# Patient Record
Sex: Female | Born: 1960 | Race: Black or African American | Hispanic: No | Smoking: Never smoker
Health system: Southern US, Community
[De-identification: ages and names within clinical notes are randomized; demographics above are authoritative.]

## PROBLEM LIST (undated history)

## (undated) DIAGNOSIS — M94261 Chondromalacia, right knee: Secondary | ICD-10-CM

## (undated) DIAGNOSIS — M549 Dorsalgia, unspecified: Secondary | ICD-10-CM

## (undated) DIAGNOSIS — F329 Major depressive disorder, single episode, unspecified: Secondary | ICD-10-CM

## (undated) DIAGNOSIS — R51 Headache: Secondary | ICD-10-CM

## (undated) DIAGNOSIS — M1990 Unspecified osteoarthritis, unspecified site: Secondary | ICD-10-CM

## (undated) DIAGNOSIS — R519 Headache, unspecified: Secondary | ICD-10-CM

## (undated) DIAGNOSIS — K219 Gastro-esophageal reflux disease without esophagitis: Secondary | ICD-10-CM

## (undated) DIAGNOSIS — F32A Depression, unspecified: Secondary | ICD-10-CM

## (undated) DIAGNOSIS — I10 Essential (primary) hypertension: Secondary | ICD-10-CM

## (undated) DIAGNOSIS — G8929 Other chronic pain: Secondary | ICD-10-CM

## (undated) DIAGNOSIS — J189 Pneumonia, unspecified organism: Secondary | ICD-10-CM

## (undated) DIAGNOSIS — M797 Fibromyalgia: Secondary | ICD-10-CM

## (undated) DIAGNOSIS — E7800 Pure hypercholesterolemia, unspecified: Secondary | ICD-10-CM

## (undated) HISTORY — PX: CHOLECYSTECTOMY: SHX55

## (undated) HISTORY — PX: ABDOMINAL HYSTERECTOMY: SHX81

## (undated) HISTORY — PX: TONSILLECTOMY: SUR1361

## (undated) HISTORY — PX: TONSILLECTOMY AND ADENOIDECTOMY: SHX28

---

## 2010-05-29 HISTORY — PX: CHOLECYSTECTOMY: SHX55

## 2011-08-24 ENCOUNTER — Emergency Department (HOSPITAL_BASED_OUTPATIENT_CLINIC_OR_DEPARTMENT_OTHER)
Admission: EM | Admit: 2011-08-24 | Discharge: 2011-08-24 | Disposition: A | Discharge status: Home / Self Care | Payer: PRIVATE HEALTH INSURANCE | Attending: Emergency Medicine | Admitting: Emergency Medicine

## 2011-08-24 ENCOUNTER — Emergency Department (INDEPENDENT_AMBULATORY_CARE_PROVIDER_SITE_OTHER): Payer: PRIVATE HEALTH INSURANCE

## 2011-08-24 ENCOUNTER — Encounter (HOSPITAL_BASED_OUTPATIENT_CLINIC_OR_DEPARTMENT_OTHER): Payer: Self-pay | Admitting: *Deleted

## 2011-08-24 DIAGNOSIS — N83209 Unspecified ovarian cyst, unspecified side: Secondary | ICD-10-CM

## 2011-08-24 DIAGNOSIS — S39012A Strain of muscle, fascia and tendon of lower back, initial encounter: Secondary | ICD-10-CM

## 2011-08-24 DIAGNOSIS — M545 Low back pain, unspecified: Secondary | ICD-10-CM | POA: Insufficient documentation

## 2011-08-24 DIAGNOSIS — R319 Hematuria, unspecified: Secondary | ICD-10-CM | POA: Insufficient documentation

## 2011-08-24 DIAGNOSIS — Z7982 Long term (current) use of aspirin: Secondary | ICD-10-CM | POA: Insufficient documentation

## 2011-08-24 DIAGNOSIS — Z90710 Acquired absence of both cervix and uterus: Secondary | ICD-10-CM

## 2011-08-24 DIAGNOSIS — IMO0002 Reserved for concepts with insufficient information to code with codable children: Secondary | ICD-10-CM | POA: Insufficient documentation

## 2011-08-24 DIAGNOSIS — X58XXXA Exposure to other specified factors, initial encounter: Secondary | ICD-10-CM | POA: Insufficient documentation

## 2011-08-24 DIAGNOSIS — E7800 Pure hypercholesterolemia, unspecified: Secondary | ICD-10-CM | POA: Insufficient documentation

## 2011-08-24 HISTORY — DX: Pure hypercholesterolemia, unspecified: E78.00

## 2011-08-24 LAB — URINALYSIS, ROUTINE W REFLEX MICROSCOPIC
Glucose, UA: NEGATIVE mg/dL
Ketones, ur: NEGATIVE mg/dL
Leukocytes, UA: NEGATIVE
Protein, ur: NEGATIVE mg/dL
Urobilinogen, UA: 0.2 mg/dL (ref 0.0–1.0)

## 2011-08-24 LAB — URINE MICROSCOPIC-ADD ON

## 2011-08-24 MED ORDER — HYDROCODONE-ACETAMINOPHEN 5-500 MG PO TABS: 1.0000 | ORAL_TABLET | Freq: Four times a day (QID) | ORAL | Status: AC | PRN

## 2011-08-24 MED ORDER — OXYCODONE-ACETAMINOPHEN 5-325 MG PO TABS
2.0000 | ORAL_TABLET | Freq: Once | ORAL | Status: AC
  Administered 2011-08-24: 2 via ORAL
  Filled 2011-08-24: qty 2

## 2011-08-24 MED ORDER — CYCLOBENZAPRINE HCL 10 MG PO TABS: 10.0000 mg | ORAL_TABLET | Freq: Two times a day (BID) | ORAL | Status: AC | PRN

## 2011-08-24 NOTE — ED Provider Notes (Signed)
History     CSN: [id number]  Arrival date & time 08/24/11  1718   First MD Initiated Contact with Patient 08/24/11 1728      Chief Complaint  Patient presents with  . Back Pain    (Consider location/radiation/quality/duration/timing/severity/associated sxs/prior treatment) Patient is a 51 y.o. female presenting with back pain. The history is provided by the patient.  Back Pain  This is a new problem. Episode onset: 4 days ago. The problem occurs constantly. The problem has been gradually worsening. The pain is associated with no known injury. The pain is present in the lumbar spine. The quality of the pain is described as aching. The pain does not radiate. The pain is moderate. The symptoms are aggravated by twisting (walking). The pain is the same all the time. Pertinent negatives include no chest pain, no fever, no numbness, no headaches, no abdominal pain, no bowel incontinence, no perianal numbness, no bladder incontinence, no dysuria, no pelvic pain, no leg pain, no paresthesias, no tingling and no weakness.    Past Medical History  Diagnosis Date  . High cholesterol     Past Surgical History  Procedure Date  . Abdominal hysterectomy   . Tonsillectomy   . Cholecystectomy     No family history on file.  History  Substance Use Topics  . Smoking status: Never Smoker   . Smokeless tobacco: Not on file  . Alcohol Use: No    OB History    Grav Para Term Preterm Abortions TAB SAB Ect Mult Living                  Review of Systems  Constitutional: Negative for fever, chills, diaphoresis and fatigue.  HENT: Negative for congestion, rhinorrhea and sneezing.   Eyes: Negative.   Respiratory: Negative for cough, chest tightness and shortness of breath.   Cardiovascular: Negative for chest pain and leg swelling.  Gastrointestinal: Negative for nausea, vomiting, abdominal pain, diarrhea, blood in stool and bowel incontinence.  Genitourinary: Negative for bladder  incontinence, dysuria, frequency, hematuria, flank pain, difficulty urinating and pelvic pain.  Musculoskeletal: Positive for back pain. Negative for arthralgias.  Skin: Negative for rash.  Neurological: Negative for dizziness, tingling, speech difficulty, weakness, numbness, headaches and paresthesias.    Allergies  Review of patient's allergies indicates no known allergies.  Home Medications   Current Outpatient Rx  Name Route Sig Dispense Refill  . ASPIRIN 81 MG PO CHEW Oral Chew 81 mg by mouth daily.    Marland Kitchen VITAMIN D-3 5000 UNITS PO TABS Oral Take 10,000 Units by mouth daily.    Marland Kitchen PRAVASTATIN SODIUM 10 MG PO TABS Oral Take 10 mg by mouth at bedtime.    . CYCLOBENZAPRINE HCL 10 MG PO TABS Oral Take 1 tablet (10 mg total) by mouth 2 (two) times daily as needed for muscle spasms. 20 tablet 0  . HYDROCODONE-ACETAMINOPHEN 5-500 MG PO TABS Oral Take 1-2 tablets by mouth every 6 (six) hours as needed for pain. 15 tablet 0    BP 151/83  Pulse 94  Temp(Src) 97.9 F (36.6 C) (Oral)  Resp 18  SpO2 100%  Physical Exam  Constitutional: She is oriented to person, place, and time. She appears well-developed and well-nourished.  HENT:  Head: Normocephalic and atraumatic.  Eyes: Pupils are equal, round, and reactive to light.  Neck: Normal range of motion. Neck supple.  Cardiovascular: Normal rate, regular rhythm and normal heart sounds.   Pulmonary/Chest: Effort normal and breath sounds normal. No  respiratory distress. She has no wheezes. She has no rales. She exhibits no tenderness.  Abdominal: Soft. Bowel sounds are normal. There is no tenderness. There is no rebound and no guarding.  Musculoskeletal: Normal range of motion. She exhibits no edema and no tenderness.       No pain along spine  Lymphadenopathy:    She has no cervical adenopathy.  Neurological: She is alert and oriented to person, place, and time. She has normal strength. No sensory deficit.  Skin: Skin is warm and dry. No  rash noted.  Psychiatric: She has a normal mood and affect.    ED Course  Procedures (including critical care time)  Results for orders placed during the hospital encounter of 08/24/11  URINALYSIS, ROUTINE W REFLEX MICROSCOPIC      Component Value Range   Color, Urine YELLOW  YELLOW    APPearance CLEAR  CLEAR    Specific Gravity, Urine 1.021  1.005 - 1.030    pH 6.5  5.0 - 8.0    Glucose, UA NEGATIVE  NEGATIVE (mg/dL)   Hgb urine dipstick MODERATE (*) NEGATIVE    Bilirubin Urine NEGATIVE  NEGATIVE    Ketones, ur NEGATIVE  NEGATIVE (mg/dL)   Protein, ur NEGATIVE  NEGATIVE (mg/dL)   Urobilinogen, UA 0.2  0.0 - 1.0 (mg/dL)   Nitrite NEGATIVE  NEGATIVE    Leukocytes, UA NEGATIVE  NEGATIVE   URINE MICROSCOPIC-ADD ON      Component Value Range   Squamous Epithelial / LPF FEW (*) RARE    WBC, UA 0-2  <3 (WBC/hpf)   RBC / HPF 7-10  <3 (RBC/hpf)   Bacteria, UA FEW (*) RARE    Ct Abdomen Pelvis Wo Contrast  08/24/2011  *RADIOLOGY REPORT*  Clinical Data: Low back pain for the past week.  Hematuria. Previous hysterectomy and cholecystectomy.  CT ABDOMEN AND PELVIS WITHOUT CONTRAST  Technique:  Multidetector CT imaging of the abdomen and pelvis was performed following the standard protocol without intravenous contrast.  Comparison: None.  Findings: Normal appearing kidneys, ureters and urinary bladder. No urinary tract calculi or hydronephrosis.  3.7 cm left ovarian cyst.  Normal appearing right ovary. Surgically absent uterus.  Normal appearing appendix in the right lower abdomen and upper pelvis.  Small number of colonic diverticula without evidence of diverticulitis.  No enlarged lymph nodes.  Cholecystectomy clips.  Unremarkable noncontrasted appearance of the liver, spleen, pancreas and adrenal glands.  Clear lung bases. Minimal lumbar spine degenerative changes.  IMPRESSION:  1.  No urinary tract calculi and no acute abnormality. 2.  3.7 cm left ovarian cyst.  This could be better  characterized with elective pelvic ultrasound.  Original Report Authenticated By: Darrol Avielle, M.D.       1. Back strain       MDM  Patient with bilateral lower back pain. It's worse with movement, but is not reproducible on palpation. There is no evidence of renal calculi. No evidence of urinary infection. Pain is likely musculoskeletal. She has no abdominal pain to suggest other abdominal etiology. I did advise her that she does have hematuria that is unexplained, and she'll need a followup with her primary care provider regarding this to have her urine rechecked. If she has persistent, hematuria she'll need to see a urologist. Maryclare Labrador prescribe her pain medicine and muscle relaxers for her back never followed a primary care physician.   1945:  Talked with pt, she has seen Dr. Pete Glatter with urology in [place] who did  a hematuria work up and she says that everything was fine     Rolan Bucco, MD 08/24/11 1949

## 2011-08-24 NOTE — Discharge Instructions (Signed)
Back Pain, Adult Low back pain is very common. About 1 in 5 people have back pain.The cause of low back pain is rarely dangerous. The pain often gets better over time.About half of people with a sudden onset of back pain feel better in just 2 weeks. About 8 in 10 people feel better by 6 weeks.  CAUSES Some common causes of back pain include:  Strain of the muscles or ligaments supporting the spine.   Wear and tear (degeneration) of the spinal discs.   Arthritis.   Direct injury to the back.  DIAGNOSIS Most of the time, the direct cause of low back pain is not known.However, back pain can be treated effectively even when the exact cause of the pain is unknown.Answering your caregiver's questions about your overall health and symptoms is one of the most accurate ways to make sure the cause of your pain is not dangerous. If your caregiver needs more information, he or she may order lab work or imaging tests (X-rays or MRIs).However, even if imaging tests show changes in your back, this usually does not require surgery. HOME CARE INSTRUCTIONS For many people, back pain returns.Since low back pain is rarely dangerous, it is often a condition that people can learn to manageon their own.   Remain active. It is stressful on the back to sit or stand in one place. Do not sit, drive, or stand in one place for more than 30 minutes at a time. Take short walks on level surfaces as soon as pain allows.Try to increase the length of time you walk each day.   Do not stay in bed.Resting more than 1 or 2 days can delay your recovery.   Do not avoid exercise or work.Your body is made to move.It is not dangerous to be active, even though your back may hurt.Your back will likely heal faster if you return to being active before your pain is gone.   Pay attention to your body when you bend and lift. Many people have less discomfortwhen lifting if they bend their knees, keep the load close to their  bodies,and avoid twisting. Often, the most comfortable positions are those that put less stress on your recovering back.   Find a comfortable position to sleep. Use a firm mattress and lie on your side with your knees slightly bent. If you lie on your back, put a pillow under your knees.   Only take over-the-counter or prescription medicines as directed by your caregiver. Over-the-counter medicines to reduce pain and inflammation are often the most helpful.Your caregiver may prescribe muscle relaxant drugs.These medicines help dull your pain so you can more quickly return to your normal activities and healthy exercise.   Put ice on the injured area.   Put ice in a plastic bag.   Place a towel between your skin and the bag.   Leave the ice on for 15 to 20 minutes, 3 to 4 times a day for the first 2 to 3 days. After that, ice and heat may be alternated to reduce pain and spasms.   Ask your caregiver about trying back exercises and gentle massage. This may be of some benefit.   Avoid feeling anxious or stressed.Stress increases muscle tension and can worsen back pain.It is important to recognize when you are anxious or stressed and learn ways to manage it.Exercise is a great option.  SEEK MEDICAL CARE IF:  You have pain that is not relieved with rest or medicine.   You have   pain that does not improve in 1 week.   You have new symptoms.   You are generally not feeling well.  SEEK IMMEDIATE MEDICAL CARE IF:   You have pain that radiates from your back into your legs.   You develop new bowel or bladder control problems.   You have unusual weakness or numbness in your arms or legs.   You develop nausea or vomiting.   You develop abdominal pain.   You feel faint.  Document Released: 05/15/2005 Document Revised: 05/04/2011 Document Reviewed: 10/03/2010 Exit[employer] Patient Information 2012 [employer]. 

## 2011-08-24 NOTE — ED Notes (Signed)
Mid to lower back pain all week. No relief with heating pad and Ibuprofen. Hx of back spasms from a pulled muscle and this pain feels the same.

## 2012-02-29 ENCOUNTER — Emergency Department (HOSPITAL_BASED_OUTPATIENT_CLINIC_OR_DEPARTMENT_OTHER)
Admission: EM | Admit: 2012-02-29 | Discharge: 2012-02-29 | Disposition: A | Discharge status: Home / Self Care | Payer: No Typology Code available for payment source | Attending: Emergency Medicine | Admitting: Emergency Medicine

## 2012-02-29 ENCOUNTER — Encounter (HOSPITAL_BASED_OUTPATIENT_CLINIC_OR_DEPARTMENT_OTHER): Payer: Self-pay | Admitting: *Deleted

## 2012-02-29 DIAGNOSIS — Z7982 Long term (current) use of aspirin: Secondary | ICD-10-CM | POA: Insufficient documentation

## 2012-02-29 DIAGNOSIS — M545 Low back pain, unspecified: Secondary | ICD-10-CM | POA: Insufficient documentation

## 2012-02-29 DIAGNOSIS — M25559 Pain in unspecified hip: Secondary | ICD-10-CM | POA: Insufficient documentation

## 2012-02-29 MED ORDER — TRAMADOL HCL 50 MG PO TABS
50.0000 mg | ORAL_TABLET | Freq: Once | ORAL | Status: AC
  Administered 2012-02-29: 50 mg via ORAL
  Filled 2012-02-29: qty 1

## 2012-02-29 MED ORDER — TRAMADOL HCL 50 MG PO TABS: 50.0000 mg | ORAL_TABLET | Freq: Four times a day (QID) | ORAL | Status: AC | PRN

## 2012-02-29 MED ORDER — KETOROLAC TROMETHAMINE 60 MG/2ML IM SOLN
60.0000 mg | Freq: Once | INTRAMUSCULAR | Status: AC
  Administered 2012-02-29: 60 mg via INTRAMUSCULAR
  Filled 2012-02-29: qty 2

## 2012-02-29 NOTE — ED Provider Notes (Signed)
History     CSN: [id number]  Arrival date & time 02/29/12  1640   First MD Initiated Contact with Patient 02/29/12 1722      Chief Complaint  Patient presents with  . Back Pain  . Hip Pain    (Consider location/radiation/quality/duration/timing/severity/associated sxs/prior treatment) HPI Comments: Patient with 3 year history of intermittent low pains.  She has occasional flareups.  This particular episode started three weeks ago.  There has been no trauma or fall.  There is no bowel or bladder complaints.    Patient is a 51 y.o. female presenting with back pain. The history is provided by the patient.  Back Pain  This is a recurrent problem. Episode onset: 3 weeks ago. The problem occurs constantly. The problem has been gradually worsening. The pain is associated with no known injury. The pain is present in the lumbar spine. The quality of the pain is described as shooting, aching and stabbing. The pain does not radiate. The pain is moderate. The symptoms are aggravated by bending, twisting and certain positions. The pain is the same all the time. Pertinent negatives include no fever, no bladder incontinence, no paresthesias, no paresis and no tingling.    Past Medical History  Diagnosis Date  . High cholesterol     Past Surgical History  Procedure Date  . Abdominal hysterectomy   . Tonsillectomy   . Cholecystectomy     History reviewed. No pertinent family history.  History  Substance Use Topics  . Smoking status: Never Smoker   . Smokeless tobacco: Not on file  . Alcohol Use: No    OB History    Grav Para Term Preterm Abortions TAB SAB Ect Mult Living                  Review of Systems  Constitutional: Negative for fever.  Genitourinary: Negative for bladder incontinence.  Musculoskeletal: Positive for back pain.  Neurological: Negative for tingling and paresthesias.  All other systems reviewed and are negative.    Allergies  Review of patient's  allergies indicates no known allergies.  Home Medications   Current Outpatient Rx  Name Route Sig Dispense Refill  . ONE-DAILY MULTI VITAMINS PO TABS Oral Take 1 tablet by mouth daily.    Marland Kitchen VITAMIN C 500 MG PO TABS Oral Take 500 mg by mouth daily.    . ASPIRIN 81 MG PO CHEW Oral Chew 81 mg by mouth daily.    Marland Kitchen VITAMIN D-3 5000 UNITS PO TABS Oral Take 10,000 Units by mouth daily.    Marland Kitchen PRAVASTATIN SODIUM 10 MG PO TABS Oral Take 10 mg by mouth at bedtime.      BP 166/82  Pulse 85  Temp 98 F (36.7 C) (Oral)  Resp 18  Ht 5\' 4"  (1.626 m)  Wt 180 lb (81.647 kg)  BMI 30.90 kg/m2  SpO2 100%  Physical Exam  Nursing note and vitals reviewed. Constitutional: She is oriented to person, place, and time. She appears well-developed and well-nourished. No distress.  HENT:  Head: Normocephalic and atraumatic.  Neck: Normal range of motion. Neck supple.  Cardiovascular: Normal rate and regular rhythm.   Pulmonary/Chest: Effort normal and breath sounds normal.  Abdominal: Soft. Bowel sounds are normal.  Musculoskeletal: Normal range of motion.       There is ttp in the lumbar soft tissues with no bony ttp or stepoffs.    Neurological: She is alert and oriented to person, place, and time.  Strength is 5/5 in the ble.  She is able to walk on heels, toes without difficulty.  Skin: Skin is warm and dry. She is not diaphoretic.    ED Course  Procedures (including critical care time)  Labs Reviewed - No data to display No results found.   No diagnosis found.    MDM  Sounds musculoskeletal in etiology.  There are no bowel or bladder complaints and no red flags for an emergent etiology.  No emergent imaging indicated.  She will be treated with nsaids and tramadol.  Offered stronger pain meds but declined.  To follow up with pcp to discuss further imaging when appropriate.          Geoffery Lyons, MD 02/29/12 [phone]

## 2012-02-29 NOTE — ED Notes (Signed)
Pt c/o bil hip and lower back pain x 1 week

## 2012-05-09 IMAGING — CT CT ABD-PELV W/O CM
2 of 4 series · 16 of 46 positions shown, 18 images · non-contrast
Comparison: None.

CLINICAL DATA: Low back pain for the past week.  Hematuria.
Previous hysterectomy and cholecystectomy.

CT ABDOMEN AND PELVIS WITHOUT CONTRAST
TECHNIQUE: Multidetector CT imaging of the abdomen and pelvis was
performed following the standard protocol without intravenous
contrast.

[Series 2: renal stone > 200 lbs 5.0 b31f · axial · 0.71mm/px · z∈[+791,+1191]mm · 13 of 88 slices shown, 15 images]
[im 4/88  soft-tissue]
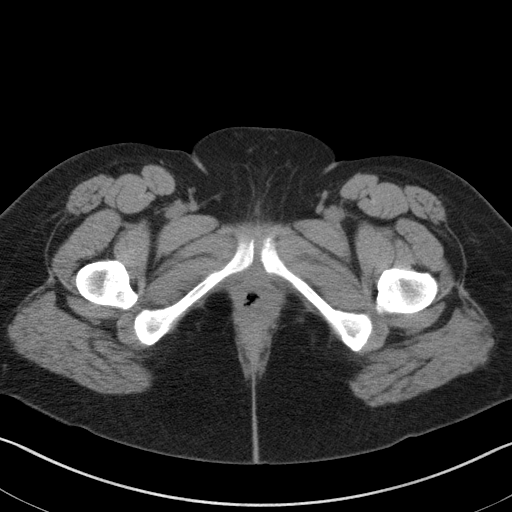
[im 4/88  bone]
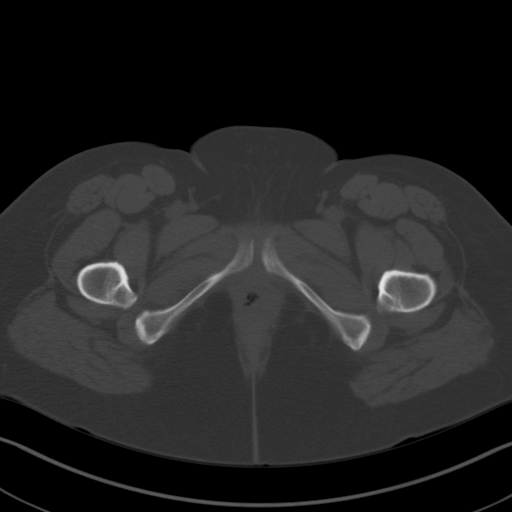
[im 11/88  soft-tissue]
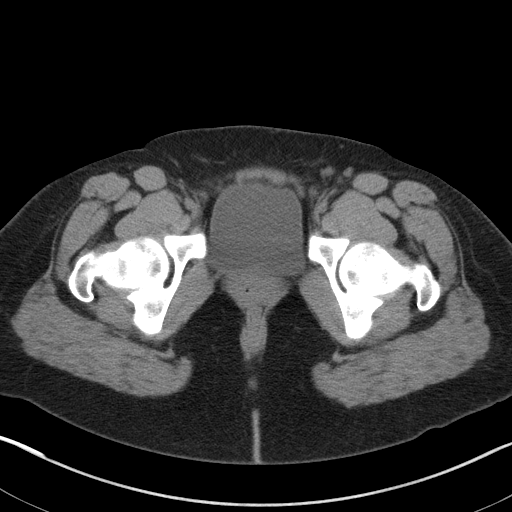
[im 18/88  soft-tissue]
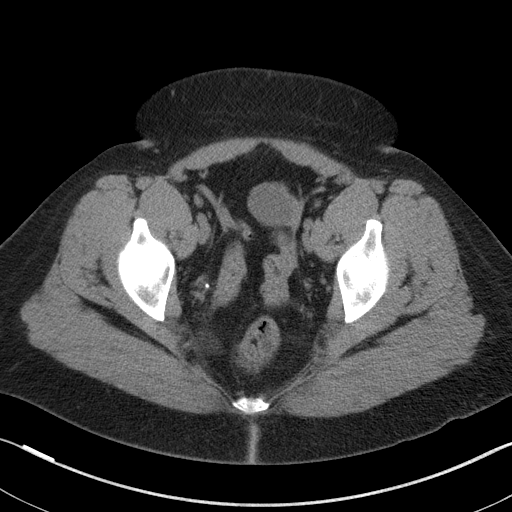
[im 25/88  soft-tissue]
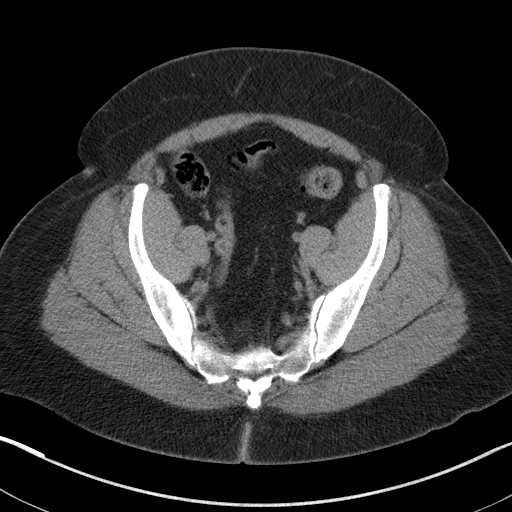
[im 32/88  soft-tissue]
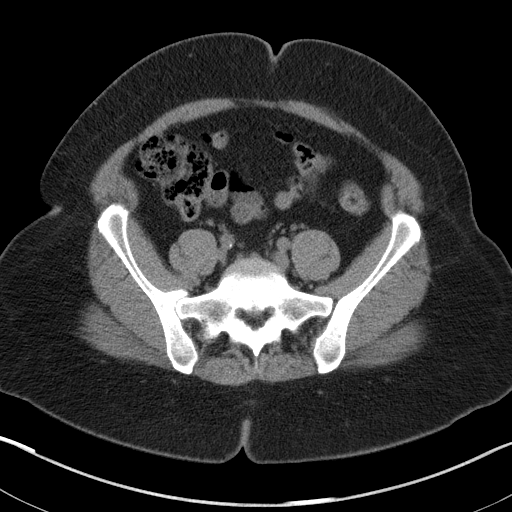
[im 39/88  soft-tissue]
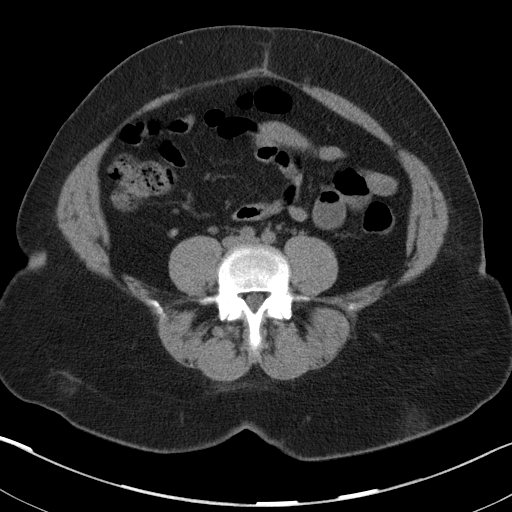
[im 46/88  soft-tissue]
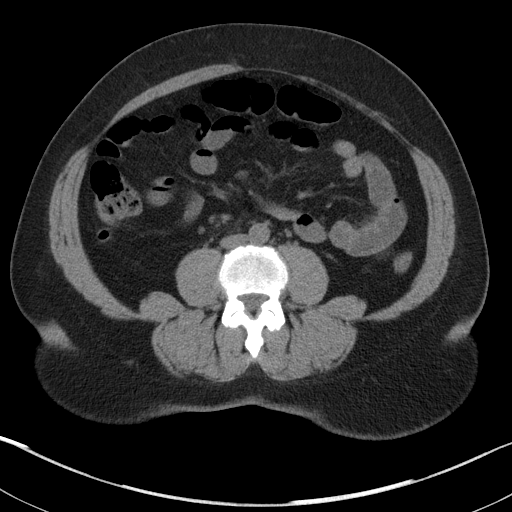
[im 49/88  soft-tissue]
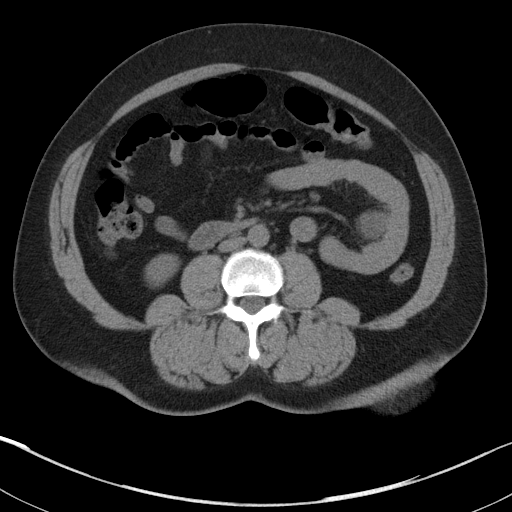
[im 56/88  soft-tissue]
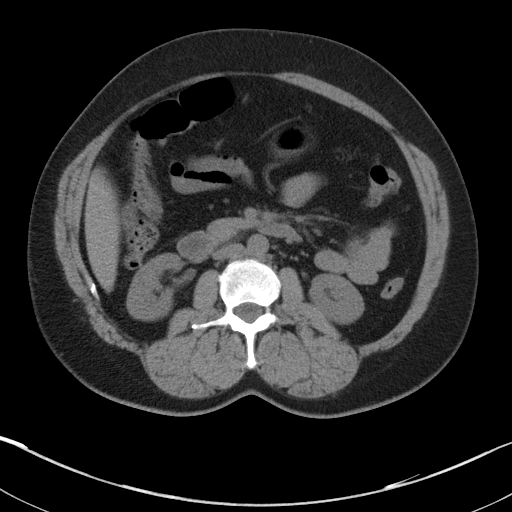
[im 56/88  bone]
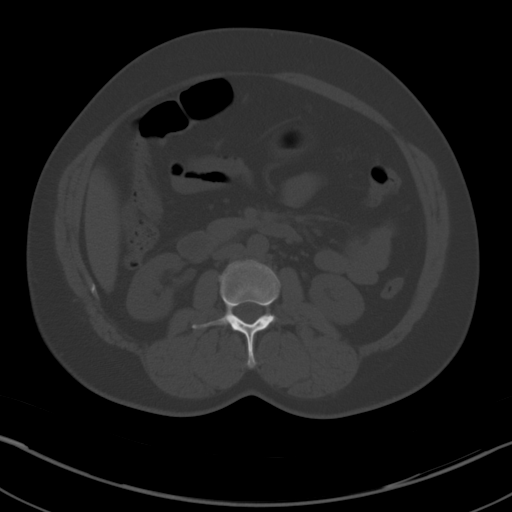
[im 63/88  soft-tissue]
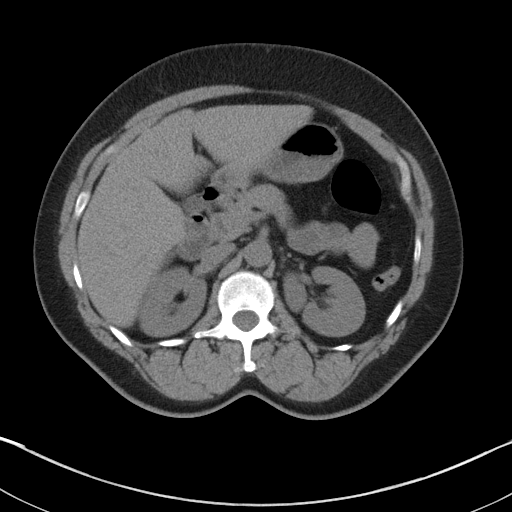
[im 70/88  soft-tissue]
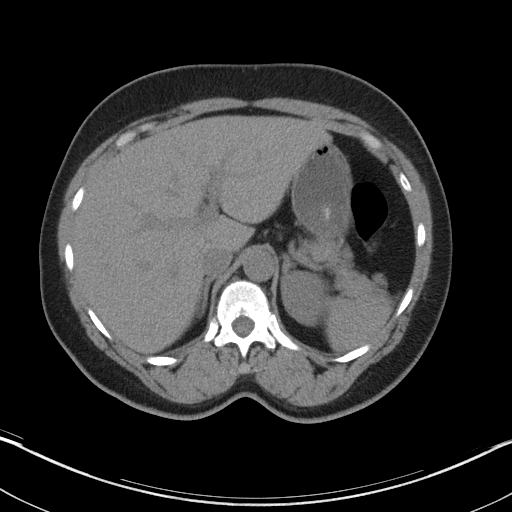
[im 77/88  soft-tissue]
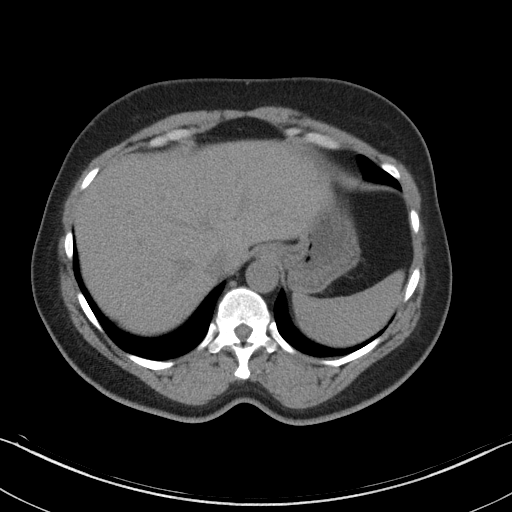
[im 84/88  soft-tissue]
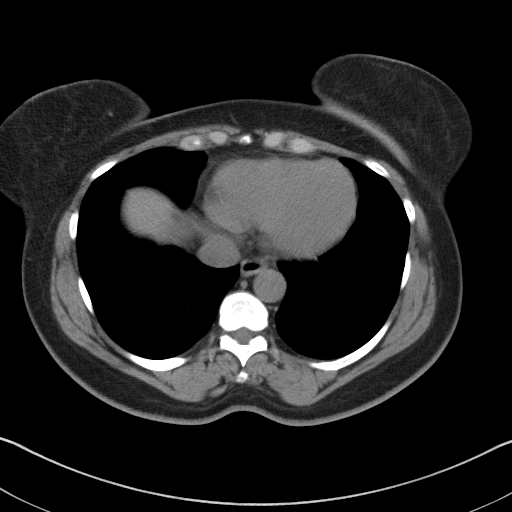

[Series 5: renal stone 3.0 coronal · coronal · 0.64mm/px · 3 of 101 slices shown]
[im 34/101  soft-tissue]
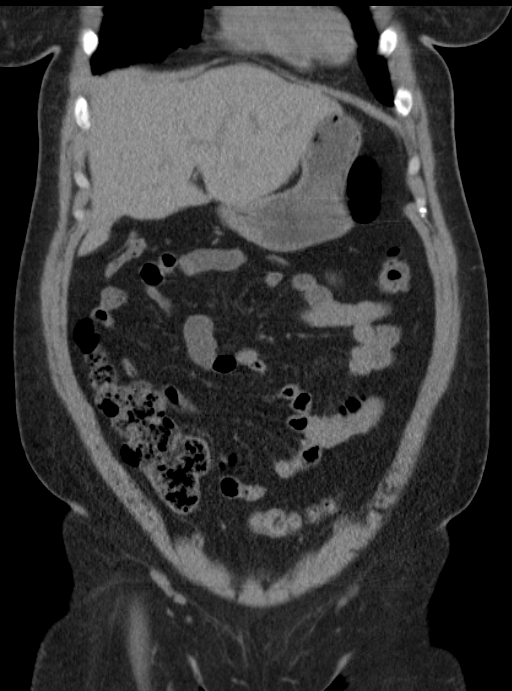
[im 45/101  soft-tissue]
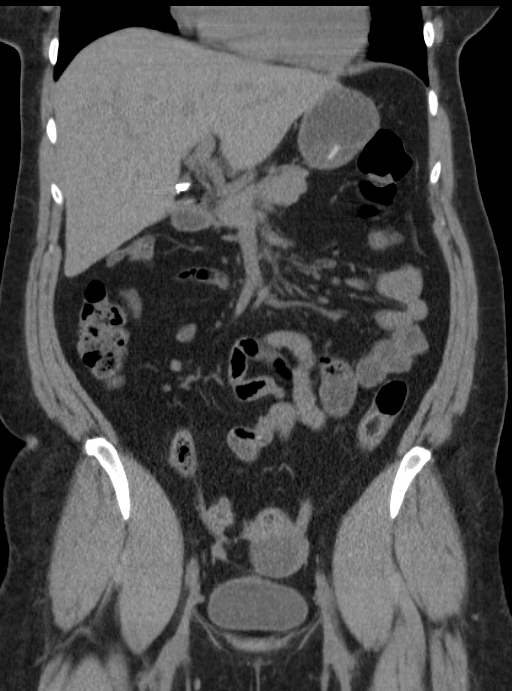
[im 56/101  soft-tissue]
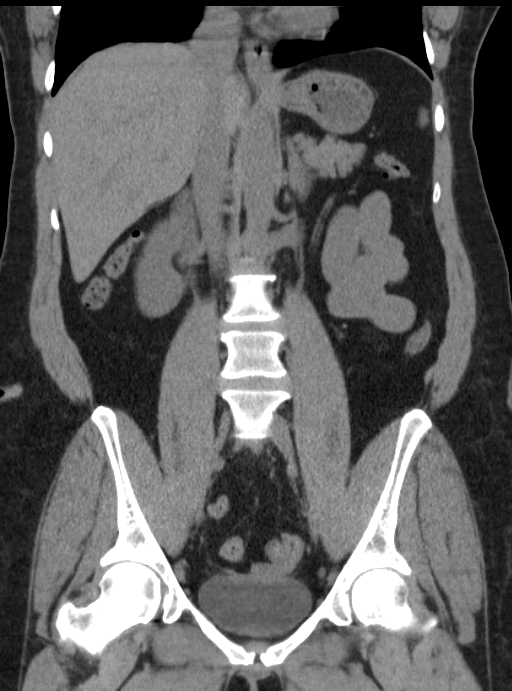

[16 of 46 positions shown; findings below may reference images not displayed]

FINDINGS: Normal appearing kidneys, ureters and urinary bladder.
No urinary tract calculi or hydronephrosis.

3.7 cm left ovarian cyst.  Normal appearing right ovary.
Surgically absent uterus.

Normal appearing appendix in the right lower abdomen and upper
pelvis.  Small number of colonic diverticula without evidence of
diverticulitis.  No enlarged lymph nodes.

Cholecystectomy clips.  Unremarkable noncontrasted appearance of
the liver, spleen, pancreas and adrenal glands.  Clear lung bases.
Minimal lumbar spine degenerative changes.
IMPRESSION: 1.  No urinary tract calculi and no acute abnormality.
2.  3.7 cm left ovarian cyst.  This could be better characterized
with elective pelvic ultrasound.

## 2013-05-29 HISTORY — PX: CARPAL TUNNEL RELEASE: SHX101

## 2014-02-26 DIAGNOSIS — M94261 Chondromalacia, right knee: Secondary | ICD-10-CM

## 2014-02-26 HISTORY — DX: Chondromalacia, right knee: M94.261

## 2014-03-05 ENCOUNTER — Other Ambulatory Visit: Payer: Self-pay | Admitting: Orthopedic Surgery

## 2014-03-17 ENCOUNTER — Encounter (HOSPITAL_BASED_OUTPATIENT_CLINIC_OR_DEPARTMENT_OTHER): Payer: Self-pay | Admitting: *Deleted

## 2014-03-18 ENCOUNTER — Encounter (HOSPITAL_BASED_OUTPATIENT_CLINIC_OR_DEPARTMENT_OTHER): Payer: Self-pay | Admitting: *Deleted

## 2014-03-23 ENCOUNTER — Encounter (HOSPITAL_BASED_OUTPATIENT_CLINIC_OR_DEPARTMENT_OTHER): Payer: Self-pay | Admitting: Anesthesiology

## 2014-03-23 ENCOUNTER — Encounter (HOSPITAL_BASED_OUTPATIENT_CLINIC_OR_DEPARTMENT_OTHER): Payer: 59 | Admitting: Anesthesiology

## 2014-03-23 ENCOUNTER — Ambulatory Visit (HOSPITAL_BASED_OUTPATIENT_CLINIC_OR_DEPARTMENT_OTHER): Payer: 59 | Admitting: Anesthesiology

## 2014-03-23 ENCOUNTER — Ambulatory Visit (HOSPITAL_BASED_OUTPATIENT_CLINIC_OR_DEPARTMENT_OTHER)
Admission: RE | Admit: 2014-03-23 | Discharge: 2014-03-23 | Disposition: A | Discharge status: Home / Self Care | Payer: 59 | Source: Ambulatory Visit | Attending: Orthopedic Surgery | Admitting: Orthopedic Surgery

## 2014-03-23 ENCOUNTER — Encounter (HOSPITAL_BASED_OUTPATIENT_CLINIC_OR_DEPARTMENT_OTHER)
Admission: RE | Disposition: A | Discharge status: Home / Self Care | Payer: Self-pay | Source: Ambulatory Visit | Attending: Orthopedic Surgery

## 2014-03-23 DIAGNOSIS — E780 Pure hypercholesterolemia: Secondary | ICD-10-CM | POA: Insufficient documentation

## 2014-03-23 DIAGNOSIS — M94261 Chondromalacia, right knee: Secondary | ICD-10-CM

## 2014-03-23 DIAGNOSIS — K219 Gastro-esophageal reflux disease without esophagitis: Secondary | ICD-10-CM | POA: Diagnosis not present

## 2014-03-23 DIAGNOSIS — M2241 Chondromalacia patellae, right knee: Secondary | ICD-10-CM | POA: Insufficient documentation

## 2014-03-23 DIAGNOSIS — Z7982 Long term (current) use of aspirin: Secondary | ICD-10-CM | POA: Diagnosis not present

## 2014-03-23 HISTORY — DX: Dorsalgia, unspecified: M54.9

## 2014-03-23 HISTORY — DX: Chondromalacia, right knee: M94.261

## 2014-03-23 HISTORY — DX: Other chronic pain: G89.29

## 2014-03-23 HISTORY — PX: KNEE ARTHROSCOPY: SHX127

## 2014-03-23 HISTORY — DX: Unspecified osteoarthritis, unspecified site: M19.90

## 2014-03-23 HISTORY — DX: Gastro-esophageal reflux disease without esophagitis: K21.9

## 2014-03-23 LAB — POCT HEMOGLOBIN-HEMACUE: HEMOGLOBIN: 14.2 g/dL (ref 12.0–15.0)

## 2014-03-23 SURGERY — ARTHROSCOPY, KNEE
Anesthesia: General | Site: Knee | Laterality: Right

## 2014-03-23 MED ORDER — LIDOCAINE HCL (CARDIAC) 20 MG/ML IV SOLN
INTRAVENOUS | Status: DC | PRN
  Administered 2014-03-23: 50 mg via INTRAVENOUS

## 2014-03-23 MED ORDER — HYDROMORPHONE HCL 1 MG/ML IJ SOLN
INTRAMUSCULAR | Status: AC
  Filled 2014-03-23: qty 1

## 2014-03-23 MED ORDER — SODIUM CHLORIDE 0.9 % IR SOLN
Status: DC | PRN
  Administered 2014-03-23: 14:00:00

## 2014-03-23 MED ORDER — LACTATED RINGERS IV SOLN
INTRAVENOUS | Status: DC
  Administered 2014-03-23 (×2): via INTRAVENOUS

## 2014-03-23 MED ORDER — OXYCODONE HCL 5 MG PO TABS: 5.0000 mg | ORAL_TABLET | Freq: Once | ORAL | Status: DC | PRN

## 2014-03-23 MED ORDER — BUPIVACAINE-EPINEPHRINE 0.5% -1:200000 IJ SOLN
INTRAMUSCULAR | Status: DC | PRN
  Administered 2014-03-23: 20 mL

## 2014-03-23 MED ORDER — HYDROCODONE-ACETAMINOPHEN 5-325 MG PO TABS: 1.0000 | ORAL_TABLET | Freq: Four times a day (QID) | ORAL | Status: DC | PRN

## 2014-03-23 MED ORDER — DEXAMETHASONE SODIUM PHOSPHATE 10 MG/ML IJ SOLN
INTRAMUSCULAR | Status: DC | PRN
  Administered 2014-03-23: 10 mg via INTRAVENOUS

## 2014-03-23 MED ORDER — MIDAZOLAM HCL 2 MG/ML PO SYRP: 12.0000 mg | ORAL_SOLUTION | Freq: Once | ORAL | Status: DC | PRN

## 2014-03-23 MED ORDER — ONDANSETRON HCL 4 MG/2ML IJ SOLN
INTRAMUSCULAR | Status: DC | PRN
  Administered 2014-03-23: 4 mg via INTRAVENOUS

## 2014-03-23 MED ORDER — OXYCODONE HCL 5 MG/5ML PO SOLN: 5.0000 mg | Freq: Once | ORAL | Status: DC | PRN

## 2014-03-23 MED ORDER — DEXTROSE-NACL 5-0.45 % IV SOLN: INTRAVENOUS | Status: DC

## 2014-03-23 MED ORDER — SCOPOLAMINE 1 MG/3DAYS TD PT72
1.0000 | MEDICATED_PATCH | Freq: Once | TRANSDERMAL | Status: DC
  Administered 2014-03-23: 1.5 mg via TRANSDERMAL

## 2014-03-23 MED ORDER — FENTANYL CITRATE 0.05 MG/ML IJ SOLN: 50.0000 ug | INTRAMUSCULAR | Status: DC | PRN

## 2014-03-23 MED ORDER — PROPOFOL 10 MG/ML IV BOLUS
INTRAVENOUS | Status: DC | PRN
  Administered 2014-03-23: 200 mg via INTRAVENOUS

## 2014-03-23 MED ORDER — CEFAZOLIN SODIUM-DEXTROSE 2-3 GM-% IV SOLR
2.0000 g | INTRAVENOUS | Status: AC
  Administered 2014-03-23: 2 g via INTRAVENOUS

## 2014-03-23 MED ORDER — MIDAZOLAM HCL 2 MG/2ML IJ SOLN: 1.0000 mg | INTRAMUSCULAR | Status: DC | PRN

## 2014-03-23 MED ORDER — MIDAZOLAM HCL 5 MG/5ML IJ SOLN
INTRAMUSCULAR | Status: DC | PRN
  Administered 2014-03-23: 2 mg via INTRAVENOUS

## 2014-03-23 MED ORDER — FENTANYL CITRATE 0.05 MG/ML IJ SOLN
INTRAMUSCULAR | Status: DC | PRN
  Administered 2014-03-23: 100 ug via INTRAVENOUS

## 2014-03-23 MED ORDER — HYDROMORPHONE HCL 1 MG/ML IJ SOLN
0.2500 mg | INTRAMUSCULAR | Status: DC | PRN
  Administered 2014-03-23 (×3): 0.5 mg via INTRAVENOUS

## 2014-03-23 SURGICAL SUPPLY — 41 items
BANDAGE ELASTIC 6 VELCRO ST LF (GAUZE/BANDAGES/DRESSINGS) ×3 IMPLANT
BLADE 4.2CUDA (BLADE) IMPLANT
BLADE CUTTER GATOR 3.5 (BLADE) ×3 IMPLANT
BLADE GREAT WHITE 4.2 (BLADE) IMPLANT
BLADE GREAT WHITE 4.2MM (BLADE)
BNDG COHESIVE 6X5 TAN STRL LF (GAUZE/BANDAGES/DRESSINGS) ×3 IMPLANT
CANISTER SUCT 3000ML (MISCELLANEOUS) IMPLANT
DRAPE ARTHROSCOPY W/POUCH 114 (DRAPES) ×3 IMPLANT
DURAPREP 26ML APPLICATOR (WOUND CARE) ×3 IMPLANT
ELECT MENISCUS 165MM 90D (ELECTRODE) IMPLANT
ELECT REM PT RETURN 9FT ADLT (ELECTROSURGICAL)
ELECTRODE REM PT RTRN 9FT ADLT (ELECTROSURGICAL) IMPLANT
GAUZE SPONGE 4X4 12PLY STRL (GAUZE/BANDAGES/DRESSINGS) ×3 IMPLANT
GAUZE XEROFORM 1X8 LF (GAUZE/BANDAGES/DRESSINGS) ×3 IMPLANT
GLOVE BIO SURGEON STRL SZ7.5 (GLOVE) ×6 IMPLANT
GLOVE BIO SURGEON STRL SZ8.5 (GLOVE) ×3 IMPLANT
GLOVE BIOGEL PI IND STRL 8 (GLOVE) ×1 IMPLANT
GLOVE BIOGEL PI IND STRL 9 (GLOVE) ×1 IMPLANT
GLOVE BIOGEL PI INDICATOR 8 (GLOVE) ×2
GLOVE BIOGEL PI INDICATOR 9 (GLOVE) ×2
GOWN STRL REUS W/ TWL LRG LVL3 (GOWN DISPOSABLE) ×1 IMPLANT
GOWN STRL REUS W/ TWL XL LVL3 (GOWN DISPOSABLE) ×2 IMPLANT
GOWN STRL REUS W/TWL LRG LVL3 (GOWN DISPOSABLE) ×2
GOWN STRL REUS W/TWL XL LVL3 (GOWN DISPOSABLE) ×4
IV NS IRRIG 3000ML ARTHROMATIC (IV SOLUTION) ×3 IMPLANT
KNEE WRAP E Z 3 GEL PACK (MISCELLANEOUS) ×3 IMPLANT
MANIFOLD NEPTUNE II (INSTRUMENTS) IMPLANT
NDL SAFETY ECLIPSE 18X1.5 (NEEDLE) ×1 IMPLANT
NEEDLE HYPO 18GX1.5 SHARP (NEEDLE) ×2
PACK ARTHROSCOPY DSU (CUSTOM PROCEDURE TRAY) ×3 IMPLANT
PACK BASIN DAY SURGERY FS (CUSTOM PROCEDURE TRAY) ×3 IMPLANT
PAD ALCOHOL SWAB (MISCELLANEOUS) ×3 IMPLANT
PENCIL BUTTON HOLSTER BLD 10FT (ELECTRODE) IMPLANT
SET ARTHROSCOPY TUBING (MISCELLANEOUS) ×2
SET ARTHROSCOPY TUBING LN (MISCELLANEOUS) ×1 IMPLANT
SLEEVE SCD COMPRESS KNEE MED (MISCELLANEOUS) IMPLANT
SYR 3ML 18GX1 1/2 (SYRINGE) IMPLANT
SYR 5ML LL (SYRINGE) ×3 IMPLANT
TOWEL OR 17X24 6PK STRL BLUE (TOWEL DISPOSABLE) ×3 IMPLANT
WAND STAR VAC 90 (SURGICAL WAND) IMPLANT
WATER STERILE IRR 1000ML POUR (IV SOLUTION) ×3 IMPLANT

## 2014-03-23 NOTE — Transfer of Care (Signed)
Immediate Anesthesia Transfer of Care Note  Patient: April Hurst  Procedure(s) Performed: Procedure(s): RIGHT ARTHROSCOPY KNEE (Right)  Patient Location: PACU  Anesthesia Type:General  Level of Consciousness: awake  Airway & Oxygen Therapy: Patient Spontanous Breathing and Patient connected to face mask oxygen  Post-op Assessment: Report given to PACU RN and Post -op Vital signs reviewed and stable  Post vital signs: Reviewed and stable  Complications: No apparent anesthesia complications

## 2014-03-23 NOTE — Discharge Instructions (Signed)
Arthroscopic Procedure, Knee °An arthroscopic procedure can find what is wrong with your knee. °PROCEDURE °Arthroscopy is a surgical technique that allows your orthopedic surgeon to diagnose and treat your knee injury with accuracy. They will look into your knee through a small instrument. This is almost like a small (pencil sized) telescope. Because arthroscopy affects your knee less than open knee surgery, you can anticipate a more rapid recovery. Taking an active role by following your caregiver's instructions will help with rapid and complete recovery. Use crutches, rest, elevation, ice, and knee exercises as instructed. The length of recovery depends on various factors including type of injury, age, physical condition, medical conditions, and your rehabilitation. °Your knee is the joint between the large bones (femur and tibia) in your leg. Cartilage covers these bone ends which are smooth and slippery and allow your knee to bend and move smoothly. Two menisci, thick, semi-lunar shaped pads of cartilage which form a rim inside the joint, help absorb shock and stabilize your knee. Ligaments bind the bones together and support your knee joint. Muscles move the joint, help support your knee, and take stress off the joint itself. Because of this all programs and physical therapy to rehabilitate an injured or repaired knee require rebuilding and strengthening your muscles. °AFTER THE PROCEDURE °· After the procedure, you will be moved to a recovery area until most of the effects of the medication have worn off. Your caregiver will discuss the test results with you. °· Only take over-the-counter or prescription medicines for pain, discomfort, or fever as directed by your caregiver. °SEEK MEDICAL CARE IF:  °· You have increased bleeding from your wounds. °· You see redness, swelling, or have increasing pain in your wounds. °· You have pus coming from your wound. °· You have an oral temperature above 102° F (38.9°  C). °· You notice a bad smell coming from the wound or dressing. °· You have severe pain with any motion of your knee. °SEEK IMMEDIATE MEDICAL CARE IF:  °· You develop a rash. °· You have difficulty breathing. °· You have any allergic problems. °Document Released: 05/12/2000 Document Revised: 08/07/2011 Document Reviewed: 12/04/2007 °[employer]® Patient Information ©2015 [employer]. This information is not intended to replace advice given to you by your health care provider. Make sure you discuss any questions you have with your health care provider. ° °Post Anesthesia Home Care Instructions ° °Activity: °Get plenty of rest for the remainder of the day. A responsible adult should stay with you for 24 hours following the procedure.  °For the next 24 hours, DO NOT: °-Drive a car °-Operate machinery °-Drink alcoholic beverages °-Take any medication unless instructed by your physician °-Make any legal decisions or sign important papers. ° °Meals: °Start with liquid foods such as gelatin or soup. Progress to regular foods as tolerated. Avoid greasy, spicy, heavy foods. If nausea and/or vomiting occur, drink only clear liquids until the nausea and/or vomiting subsides. Call your physician if vomiting continues. ° °Special Instructions/Symptoms: °Your throat may feel dry or sore from the anesthesia or the breathing tube placed in your throat during surgery. If this causes discomfort, gargle with warm salt water. The discomfort should disappear within 24 hours. ° °

## 2014-03-23 NOTE — Op Note (Signed)
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 Turner Danielsowan M.D.  Assist: Tomi LikensEric K. Gaylene BrooksPhillips PA-C  (present throughout entire procedure and necessary for timely completion of the procedure) Anes: General LMA  EBL: Minimal  Fluids: 800 cc   Indications: Patient sustained an injury some months ago and said catching popping and pain in her right knee. Pain causes a needed buckle from discomfort and interferes with sleep on occasion.. Pt has failed conservative treatment with anti-inflammatory medicines, physical therapy, and modified activites but did get good temporarily from an intra-articular cortisone injection. Pain has recurred and patient desires elective arthroscopic evaluation and treatment of knee. Risks and benefits of surgery have been discussed and questions answered.  Procedure: Patient identified by arm band and taken to the operating room at the day surgery Center. The appropriate anesthetic monitors were attached, and General LMA anesthesia was induced without difficulty. Lateral post was applied to the table and the lower extremity was prepped and draped in usual sterile fashion from the ankle to the midthigh. Time out procedure was performed. We began the operation by making standard inferior lateral and inferior medial peripatellar portals with a #11 blade allowing introduction of the arthroscope through the inferior lateral portal and the out flow to the inferior medial portal. Pump pressure was set at 100 mmHg and diagnostic arthroscopy  revealed apical chondromalacia grade 3 focal grade 4 over a 2 x 4 mm area. This is debrided back to stable margin with 35 Gator sucker shaver alternating portals. The trochlea was in excellentCondition the medial and lateral compartments were pristine  the cruciate ligaments were intact.. The knee was irrigated out normal saline solution. A dressing of c xerofoam 4 x 4 dressing sponges, web roll and an Ace wrap was applied. The patient was awakened extubated and taken to the recovery without difficulty.    Signed: Nestor LewandowskyFrank J Marquarius Lofton, MD

## 2014-03-23 NOTE — H&P (Signed)
April Hurst is an 53 y.o. female.   Chief Complaint:  Right knee chondromalacia patella HPI: Patient states stained injury to her right knee with catching popping and pain that has failed conservative treatment with physical therapy, anti-inflammatory medicine, and cortisone injections on provided short-term temporary relief. MRI scan is consistent with a full-thickness crack through the apex of the cartilage of the patella and the patient would like to have arthroscopic evaluation and treatment to diminish pain and increase function. Has had symptoms for many months. The pain it causes her needle occasionally buckle, occasionally wakes her up at night and she is miserable  Past Medical History  Diagnosis Date  . High cholesterol   . Arthritis     back  . GERD (gastroesophageal reflux disease)   . Chronic back pain   . 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 Abdominal hysterectomy    . Tonsillectomy    . Cholecystectomy    . Abdominal hysterectomy  1990s    partial  . Tonsillectomy and adenoidectomy  1990s  . Cholecystectomy  2012  . Carpal tunnel release Bilateral 2015     one 10/2013; one 12/2013    History reviewed. No pertinent family history. Social History:  reports that she has never smoked. She has never used smokeless tobacco. She reports that she does not drink alcohol or use illicit drugs.  Allergies: No Known Allergies  Medications Prior to Admission  Medication Sig Dispense Refill  . DULoxetine (CYMBALTA) 20 MG capsule Take 20 mg by mouth daily.      Marland Kitchen. gabapentin (NEURONTIN) 300 MG capsule Take 300 mg by mouth 3 (three) times daily. 300 mg. In AM, 300 mg. At noon, and 900 mg. At night      . pantoprazole (PROTONIX) 40 MG tablet Take 40 mg by mouth 2 (two) times daily.      Marland Kitchen. aspirin 81 MG chewable tablet Chew 81 mg by mouth daily.      . Cholecalciferol (VITAMIN D-3) 5000 UNITS TABS Take 10,000 Units by mouth  daily.      . Multiple Vitamin (MULTIVITAMIN) tablet Take 1 tablet by mouth daily.      . pravastatin (PRAVACHOL) 10 MG tablet Take 10 mg by mouth at bedtime.      . traMADol (ULTRAM) 50 MG tablet Take 1 tablet (50 mg total) by mouth every 6 (six) hours as needed for pain.  15 tablet  0  . vitamin C (ASCORBIC ACID) 500 MG tablet Take 500 mg by mouth daily.        No results found for this or any previous visit (from the past 48 hour(s)). No results found.  ROS patient denies any chest pain shortness of breath fevers chills or swelling to the knee. There is catching and popping though when she moves it.  Blood pressure 156/75, pulse 64, temperature 98.1 F (36.7 C), temperature source Oral, resp. rate 18, height 5\' 4"  (1.626 m), weight 83.915 kg (185 lb), SpO2 100.00%. Physical Exam the right knee has a full range of motion, no effusion, collateral ligaments are stable, she is neurovascularly intact. Patellar grind test is positive quad capture test is also positive. Apprehension sign is also positive. Normal sensation of the feet normal pulses and plain radiographs are unremarkable.  Assessment/Plan Symptomatic chondromalacia patella with full-thickness crack to the apex of the patella as diagnosed by clinical and MRI exam. Patient will be taken right scalp  evaluation treatment risks and benefits of surgery were discussed and all questions answered.  Glendia Olshefski J 03/23/2014, 1:19 PM

## 2014-03-23 NOTE — Anesthesia Postprocedure Evaluation (Signed)
  Anesthesia Post-op Note  Patient: April Hurst  Procedure(s) Performed: Procedure(s): RIGHT ARTHROSCOPY KNEE (Right)  Patient Location: PACU  Anesthesia Type:General  Level of Consciousness: awake and alert   Airway and Oxygen Therapy: Patient Spontanous Breathing  Post-op Pain: none  Post-op Assessment: Post-op Vital signs reviewed, Patient's Cardiovascular Status Stable and Respiratory Function Stable  Post-op Vital Signs: Reviewed  Filed Vitals:   03/23/14 1430  BP: 172/86  Pulse: 67  Temp:   Resp: 19    Complications: No apparent anesthesia complications

## 2014-03-23 NOTE — Interval H&P Note (Signed)
History and Physical Interval Note:  03/23/2014 1:22 PM  [name]  has presented today for surgery, with the diagnosis of RIGHT KNEE CHONDROMALACIA  The various methods of treatment have been discussed with the patient and family. After consideration of risks, benefits and other options for treatment, the patient has consented to  Procedure(s): RIGHT ARTHROSCOPY KNEE (Right) as a surgical intervention .  The patient's history has been reviewed, patient examined, no change in status, stable for surgery.  I have reviewed the patient's chart and labs.  Questions were answered to the patient's satisfaction.     Nestor LewandowskyOWAN,April Hurst

## 2014-03-23 NOTE — Anesthesia Procedure Notes (Signed)
Procedure Name: LMA Insertion Date/Time: 03/23/2014 1:31 PM Performed by: Caren MacadamARTER, Akya Fiorello W Pre-anesthesia Checklist: Patient identified, Emergency Drugs available, Suction available and Patient being monitored Patient Re-evaluated:Patient Re-evaluated prior to inductionOxygen Delivery Method: Circle System Utilized Preoxygenation: Pre-oxygenation with 100% oxygen Intubation Type: IV induction Ventilation: Mask ventilation without difficulty LMA: LMA inserted LMA Size: 4.0 Number of attempts: 1 Airway Equipment and Method: bite block Placement Confirmation: positive ETCO2 and breath sounds checked- equal and bilateral Tube secured with: Tape Dental Injury: Teeth and Oropharynx as per pre-operative assessment

## 2014-03-23 NOTE — Anesthesia Preprocedure Evaluation (Addendum)
Anesthesia Evaluation  Patient identified by MRN, date of birth, ID band Patient awake    Reviewed: Allergy & Precautions, H&P , NPO status , Patient's Chart, lab work & pertinent test results  Airway Mallampati: II  TM Distance: >3 FB Neck ROM: Full    Dental no notable dental hx. (+) Teeth Intact, Dental Advisory Given   Pulmonary neg pulmonary ROS,  breath sounds clear to auscultation  Pulmonary exam normal       Cardiovascular negative cardio ROS  Rhythm:Regular Rate:Normal     Neuro/Psych negative neurological ROS  negative psych ROS   GI/Hepatic Neg liver ROS, GERD-  Medicated and Controlled,  Endo/Other  negative endocrine ROS  Renal/GU negative Renal ROS  negative genitourinary   Musculoskeletal   Abdominal   Peds  Hematology negative hematology ROS (+)   Anesthesia Other Findings   Reproductive/Obstetrics negative OB ROS                             Anesthesia Physical Anesthesia Plan  ASA: II  Anesthesia Plan: General   Post-op Pain Management:    Induction: Intravenous  Airway Management Planned: LMA  Additional Equipment:   Intra-op Plan:   Post-operative Plan: Extubation in OR  Informed Consent: I have reviewed the patients History and Physical, chart, labs and discussed the procedure including the risks, benefits and alternatives for the proposed anesthesia with the patient or authorized representative who has indicated his/her understanding and acceptance.   Dental advisory given  Plan Discussed with: CRNA  Anesthesia Plan Comments:         Anesthesia Quick Evaluation  

## 2014-03-24 ENCOUNTER — Encounter (HOSPITAL_BASED_OUTPATIENT_CLINIC_OR_DEPARTMENT_OTHER): Payer: Self-pay | Admitting: Orthopedic Surgery

## 2014-04-14 ENCOUNTER — Other Ambulatory Visit: Payer: Self-pay | Admitting: Orthopedic Surgery

## 2014-04-15 ENCOUNTER — Encounter (HOSPITAL_BASED_OUTPATIENT_CLINIC_OR_DEPARTMENT_OTHER): Payer: Self-pay | Admitting: *Deleted

## 2014-04-15 NOTE — Progress Notes (Signed)
Pt was here 10/15 for knee scope-did well-no labs needed

## 2014-04-17 NOTE — H&P (Signed)
April Hurst is an 53 y.o. female.   Chief Complaint: Jaci CarrelRight shoulder pain  HPI: Mrs. Delford FieldWright is here today for followup on right shoulder pain.  This patient was last seen on 03/31/14 at which time she was referred for an MRI of the right shoulder.  Patient had received 2 cortisone injections previous to this visit.  Patient did have some moderate relief with one injection in the second injection provided minimal relief.  We'll obtain the MRI to discuss surgical intervention if warranted.  She denies any new injury.  She states that her shoulder pain has continued.  She denies any fevers chills night sweats or other signs of infection.  Past Medical History  Diagnosis Date  . High cholesterol   . Arthritis     back  . GERD (gastroesophageal reflux disease)   . Chronic back pain   . 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 Abdominal hysterectomy    . Tonsillectomy    . Cholecystectomy    . Abdominal hysterectomy  1990s    partial  . Tonsillectomy and adenoidectomy  1990s  . Cholecystectomy  2012  . Carpal tunnel release Bilateral 2015     one 10/2013; one 12/2013  . Knee arthroscopy Right 03/23/2014    Procedure: RIGHT ARTHROSCOPY KNEE;  Surgeon: Nestor LewandowskyFrank J Rowan, MD;  Location: [facility];  Service: Orthopedics;  Laterality: Right;    History reviewed. No pertinent family history. Social History:  reports that she has never smoked. She has never used smokeless tobacco. She reports that she does not drink alcohol or use illicit drugs.  Allergies: No Known Allergies  No prescriptions prior to admission    No results found for this or any previous visit (from the past 48 hour(s)). No results found.  Review of Systems  Constitutional: Negative.   HENT: Negative.   Eyes: Negative.   Respiratory: Negative.   Cardiovascular: Negative.   Gastrointestinal: Negative.   Genitourinary: Negative.   Musculoskeletal:  Positive for joint pain.       Rheumatoid  Skin: Negative.   Neurological: Negative.   Endo/Heme/Allergies: Negative.   Psychiatric/Behavioral: Negative.     Height 5\' 4"  (1.626 m), weight 83.915 kg (185 lb). Physical Exam  Constitutional: She is oriented to person, place, and time. She appears well-developed and well-nourished.  HENT:  Head: Normocephalic and atraumatic.  Eyes: Pupils are equal, round, and reactive to light.  Neck: Normal range of motion. Neck supple.  Cardiovascular: Intact distal pulses.   Respiratory: Effort normal.  Musculoskeletal: She exhibits tenderness.  Today, the patient's right shoulder continues to have pain with cross body impingement maneuvers.  She also has pain with extremes of motion including forward flexion and abduction.  She is neurovascularly intact distally.  Neurological: She is alert and oriented to person, place, and time.  Skin: Skin is warm and dry.  Psychiatric: She has a normal mood and affect. Her behavior is normal. Judgment and thought content normal.     Assessment/Plan Assess: Right shoulder impingement with type II acromion  Plan: Treatment options are discussed with the patient.  This patient is also discussed with Dr. Turner Danielsowan who is a gram is a patient.  He reviews the patient's MRI.  She is elected to proceed with shoulder arthroscopy with subacromial decompression and distal clavicle asked me if needed.  The benefits risks and potential complications of surgery are discussed with the patient.  A  posting slip is completed and she is to discuss scheduling with Agustin CreeKathy Blume.  She is asked to followup as needed for the shoulder.  We anticipate seeing her back approximately 10 days postop.  Call with any issues.  Negin Hegg R 04/17/2014, 1:34 PM

## 2014-04-20 ENCOUNTER — Ambulatory Visit (HOSPITAL_BASED_OUTPATIENT_CLINIC_OR_DEPARTMENT_OTHER): Payer: 59 | Admitting: Anesthesiology

## 2014-04-20 ENCOUNTER — Encounter (HOSPITAL_BASED_OUTPATIENT_CLINIC_OR_DEPARTMENT_OTHER): Payer: Self-pay | Admitting: Anesthesiology

## 2014-04-20 ENCOUNTER — Ambulatory Visit (HOSPITAL_BASED_OUTPATIENT_CLINIC_OR_DEPARTMENT_OTHER)
Admission: RE | Admit: 2014-04-20 | Discharge: 2014-04-20 | Disposition: A | Discharge status: Home / Self Care | Payer: 59 | Source: Ambulatory Visit | Attending: Orthopedic Surgery | Admitting: Orthopedic Surgery

## 2014-04-20 ENCOUNTER — Encounter (HOSPITAL_BASED_OUTPATIENT_CLINIC_OR_DEPARTMENT_OTHER)
Admission: RE | Disposition: A | Discharge status: Home / Self Care | Payer: Self-pay | Source: Ambulatory Visit | Attending: Orthopedic Surgery

## 2014-04-20 DIAGNOSIS — E780 Pure hypercholesterolemia: Secondary | ICD-10-CM | POA: Diagnosis not present

## 2014-04-20 DIAGNOSIS — K219 Gastro-esophageal reflux disease without esophagitis: Secondary | ICD-10-CM | POA: Insufficient documentation

## 2014-04-20 DIAGNOSIS — M549 Dorsalgia, unspecified: Secondary | ICD-10-CM | POA: Insufficient documentation

## 2014-04-20 DIAGNOSIS — G8929 Other chronic pain: Secondary | ICD-10-CM | POA: Diagnosis not present

## 2014-04-20 DIAGNOSIS — M7541 Impingement syndrome of right shoulder: Secondary | ICD-10-CM | POA: Diagnosis not present

## 2014-04-20 DIAGNOSIS — M19011 Primary osteoarthritis, right shoulder: Secondary | ICD-10-CM | POA: Insufficient documentation

## 2014-04-20 DIAGNOSIS — M25511 Pain in right shoulder: Secondary | ICD-10-CM | POA: Diagnosis present

## 2014-04-20 LAB — POCT HEMOGLOBIN-HEMACUE: Hemoglobin: 13.6 g/dL (ref 12.0–15.0)

## 2014-04-20 SURGERY — SHOULDER ARTHROSCOPY WITH SUBACROMIAL DECOMPRESSION AND DISTAL CLAVICLE EXCISION
Anesthesia: General | Site: Shoulder | Laterality: Right

## 2014-04-20 MED ORDER — OXYCODONE HCL 5 MG/5ML PO SOLN: 5.0000 mg | Freq: Once | ORAL | Status: DC | PRN

## 2014-04-20 MED ORDER — PROPOFOL 10 MG/ML IV BOLUS
INTRAVENOUS | Status: DC | PRN
  Administered 2014-04-20: 150 mg via INTRAVENOUS
  Administered 2014-04-20: 50 mg via INTRAVENOUS

## 2014-04-20 MED ORDER — ONDANSETRON HCL 4 MG/2ML IJ SOLN
INTRAMUSCULAR | Status: DC | PRN
  Administered 2014-04-20: 4 mg via INTRAVENOUS

## 2014-04-20 MED ORDER — MIDAZOLAM HCL 2 MG/2ML IJ SOLN
INTRAMUSCULAR | Status: AC
  Filled 2014-04-20: qty 2

## 2014-04-20 MED ORDER — PROMETHAZINE HCL 25 MG/ML IJ SOLN: 6.2500 mg | INTRAMUSCULAR | Status: DC | PRN

## 2014-04-20 MED ORDER — FENTANYL CITRATE 0.05 MG/ML IJ SOLN
INTRAMUSCULAR | Status: AC
  Filled 2014-04-20: qty 8

## 2014-04-20 MED ORDER — FENTANYL CITRATE 0.05 MG/ML IJ SOLN
INTRAMUSCULAR | Status: AC
  Filled 2014-04-20: qty 2

## 2014-04-20 MED ORDER — CHLORHEXIDINE GLUCONATE 4 % EX LIQD: 60.0000 mL | Freq: Once | CUTANEOUS | Status: DC

## 2014-04-20 MED ORDER — FENTANYL CITRATE 0.05 MG/ML IJ SOLN
50.0000 ug | INTRAMUSCULAR | Status: DC | PRN
  Administered 2014-04-20: 100 ug via INTRAVENOUS

## 2014-04-20 MED ORDER — LIDOCAINE HCL 4 % MT SOLN
OROMUCOSAL | Status: DC | PRN
  Administered 2014-04-20: 3 mL via TOPICAL

## 2014-04-20 MED ORDER — CEFAZOLIN SODIUM-DEXTROSE 2-3 GM-% IV SOLR
INTRAVENOUS | Status: AC
  Filled 2014-04-20: qty 50

## 2014-04-20 MED ORDER — BUPIVACAINE-EPINEPHRINE (PF) 0.5% -1:200000 IJ SOLN
INTRAMUSCULAR | Status: DC | PRN
  Administered 2014-04-20: 25 mL via PERINEURAL

## 2014-04-20 MED ORDER — SODIUM CHLORIDE 0.9 % IR SOLN
Status: DC | PRN
  Administered 2014-04-20: 6000 mL

## 2014-04-20 MED ORDER — FENTANYL CITRATE 0.05 MG/ML IJ SOLN: 25.0000 ug | INTRAMUSCULAR | Status: DC | PRN

## 2014-04-20 MED ORDER — MIDAZOLAM HCL 2 MG/2ML IJ SOLN
1.0000 mg | INTRAMUSCULAR | Status: DC | PRN
  Administered 2014-04-20: 2 mg via INTRAVENOUS

## 2014-04-20 MED ORDER — HYDROCODONE-ACETAMINOPHEN 5-325 MG PO TABS: 1.0000 | ORAL_TABLET | Freq: Four times a day (QID) | ORAL | Status: AC | PRN

## 2014-04-20 MED ORDER — EPINEPHRINE HCL 1 MG/ML IJ SOLN
INTRAMUSCULAR | Status: DC | PRN
  Administered 2014-04-20: 2 mL

## 2014-04-20 MED ORDER — DEXAMETHASONE SODIUM PHOSPHATE 4 MG/ML IJ SOLN
INTRAMUSCULAR | Status: DC | PRN
  Administered 2014-04-20: 10 mg via INTRAVENOUS

## 2014-04-20 MED ORDER — SUCCINYLCHOLINE CHLORIDE 20 MG/ML IJ SOLN
INTRAMUSCULAR | Status: DC | PRN
  Administered 2014-04-20: 100 mg via INTRAVENOUS

## 2014-04-20 MED ORDER — LACTATED RINGERS IV SOLN
INTRAVENOUS | Status: DC
  Administered 2014-04-20 (×2): via INTRAVENOUS

## 2014-04-20 MED ORDER — OXYCODONE HCL 5 MG PO TABS: 5.0000 mg | ORAL_TABLET | Freq: Once | ORAL | Status: DC | PRN

## 2014-04-20 MED ORDER — CEFAZOLIN SODIUM-DEXTROSE 2-3 GM-% IV SOLR
2.0000 g | INTRAVENOUS | Status: AC
  Administered 2014-04-20: 2 g via INTRAVENOUS

## 2014-04-20 MED ORDER — DEXTROSE-NACL 5-0.45 % IV SOLN: INTRAVENOUS | Status: DC

## 2014-04-20 SURGICAL SUPPLY — 66 items
BLADE AVERAGE 25MMX9MM (BLADE)
BLADE AVERAGE 25X9 (BLADE) IMPLANT
BLADE CUTTER GATOR 3.5 (BLADE) IMPLANT
BLADE GREAT WHITE 4.2 (BLADE) ×2 IMPLANT
BLADE GREAT WHITE 4.2MM (BLADE) ×1
BLADE SURG 15 STRL LF DISP TIS (BLADE) IMPLANT
BLADE SURG 15 STRL SS (BLADE)
BUR EGG 3PK/BX (BURR) IMPLANT
BUR VERTEX HOODED 4.5 (BURR) ×3 IMPLANT
CANISTER SUCT 3000ML (MISCELLANEOUS) IMPLANT
CANNULA 5.75X71 LONG (CANNULA) IMPLANT
CANNULA TWIST IN 8.25X7CM (CANNULA) IMPLANT
DECANTER SPIKE VIAL GLASS SM (MISCELLANEOUS) IMPLANT
DRAPE INCISE IOBAN 66X45 STRL (DRAPES) ×3 IMPLANT
DRAPE SHOULDER BEACH CHAIR (DRAPES) ×3 IMPLANT
DURAPREP 26ML APPLICATOR (WOUND CARE) ×3 IMPLANT
ELECT REM PT RETURN 9FT ADLT (ELECTROSURGICAL)
ELECTRODE REM PT RTRN 9FT ADLT (ELECTROSURGICAL) IMPLANT
GAUZE SPONGE 4X4 12PLY STRL (GAUZE/BANDAGES/DRESSINGS) ×3 IMPLANT
GAUZE XEROFORM 1X8 LF (GAUZE/BANDAGES/DRESSINGS) ×3 IMPLANT
GLOVE BIO SURGEON STRL SZ 6.5 (GLOVE) ×2 IMPLANT
GLOVE BIO SURGEON STRL SZ7.5 (GLOVE) ×3 IMPLANT
GLOVE BIO SURGEON STRL SZ8.5 (GLOVE) ×3 IMPLANT
GLOVE BIO SURGEONS STRL SZ 6.5 (GLOVE) ×1
GLOVE BIOGEL PI IND STRL 7.0 (GLOVE) ×2 IMPLANT
GLOVE BIOGEL PI IND STRL 8 (GLOVE) ×1 IMPLANT
GLOVE BIOGEL PI IND STRL 9 (GLOVE) ×1 IMPLANT
GLOVE BIOGEL PI INDICATOR 7.0 (GLOVE) ×4
GLOVE BIOGEL PI INDICATOR 8 (GLOVE) ×2
GLOVE BIOGEL PI INDICATOR 9 (GLOVE) ×2
GOWN STRL REUS W/ TWL LRG LVL3 (GOWN DISPOSABLE) ×1 IMPLANT
GOWN STRL REUS W/ TWL LRG LVL4 (GOWN DISPOSABLE) ×1 IMPLANT
GOWN STRL REUS W/TWL LRG LVL3 (GOWN DISPOSABLE) ×2
GOWN STRL REUS W/TWL LRG LVL4 (GOWN DISPOSABLE) ×2
GOWN STRL REUS W/TWL XL LVL4 (GOWN DISPOSABLE) ×3 IMPLANT
IV NS IRRIG 3000ML ARTHROMATIC (IV SOLUTION) ×6 IMPLANT
MANIFOLD NEPTUNE II (INSTRUMENTS) ×3 IMPLANT
NDL SAFETY ECLIPSE 18X1.5 (NEEDLE) ×1 IMPLANT
NDL SUT 6 .5 CRC .975X.05 MAYO (NEEDLE) IMPLANT
NEEDLE HYPO 18GX1.5 SHARP (NEEDLE) ×2
NEEDLE MAYO TAPER (NEEDLE)
NS IRRIG 1000ML POUR BTL (IV SOLUTION) IMPLANT
PACK ARTHROSCOPY DSU (CUSTOM PROCEDURE TRAY) ×3 IMPLANT
PACK BASIN DAY SURGERY FS (CUSTOM PROCEDURE TRAY) ×3 IMPLANT
PASSER SUT SWANSON 36MM LOOP (INSTRUMENTS) IMPLANT
PENCIL BUTTON HOLSTER BLD 10FT (ELECTRODE) IMPLANT
SET IRRIG Y TYPE TUR BLADDER L (SET/KITS/TRAYS/PACK) ×3 IMPLANT
SLEEVE SCD COMPRESS KNEE MED (MISCELLANEOUS) ×3 IMPLANT
SLING ARM IMMOBILIZER LRG (SOFTGOODS) IMPLANT
SLING ARM LRG ADULT FOAM STRAP (SOFTGOODS) ×3 IMPLANT
SLING ARM MED ADULT FOAM STRAP (SOFTGOODS) IMPLANT
SPONGE LAP 4X18 X RAY DECT (DISPOSABLE) IMPLANT
SUCTION FRAZIER TIP 10 FR DISP (SUCTIONS) IMPLANT
SUT ETHIBOND 2 OS 4 DA (SUTURE) IMPLANT
SUT ETHILON 4 0 PS 2 18 (SUTURE) IMPLANT
SUT MNCRL AB 4-0 PS2 18 (SUTURE) IMPLANT
SUT VIC AB 3-0 PS1 18 (SUTURE)
SUT VIC AB 3-0 PS1 18XBRD (SUTURE) IMPLANT
SYR 5ML LL (SYRINGE) ×3 IMPLANT
SYR TB 1ML LL NO SAFETY (SYRINGE) ×3 IMPLANT
TAPE PAPER 3X10 WHT MICROPORE (GAUZE/BANDAGES/DRESSINGS) ×3 IMPLANT
TOWEL OR 17X24 6PK STRL BLUE (TOWEL DISPOSABLE) ×3 IMPLANT
TUBE CONNECTING 20'X1/4 (TUBING)
TUBE CONNECTING 20X1/4 (TUBING) IMPLANT
WAND STAR VAC 90 (SURGICAL WAND) ×3 IMPLANT
WATER STERILE IRR 1000ML POUR (IV SOLUTION) ×3 IMPLANT

## 2014-04-20 NOTE — Interval H&P Note (Signed)
History and Physical Interval Note:  04/20/2014 1:47 PM  April Hurst  has presented today for surgery, with the diagnosis of RIGHT SHOULDER IMPINGEMENT   The various methods of treatment have been discussed with the patient and family. After consideration of risks, benefits and other options for treatment, the patient has consented to  Procedure(s): RIGHT SHOULDER ARTHROSCOPY WITH DECOMPRESSION (Right) as a surgical intervention .  The patient's history has been reviewed, patient examined, no change in status, stable for surgery.  I have reviewed the patient's chart and labs.  Questions were answered to the patient's satisfaction.     Nestor LewandowskyOWAN,[name]

## 2014-04-20 NOTE — Discharge Instructions (Signed)
Surgery for Impingement Syndrome, Subacromial Decompression °Subacromial decompression surgery is for patients with rotator cuff tendinitis, subacromial bursitis (inflamed, fluid-filled sac between the shoulder joint and top of the shoulder blade), or impingement syndrome (inflamed rotator cuff tendons due to pinching). Surgery is for patients with continued shoulder pain despite at least 3 months of rehabilitation treatment. The shoulder pain is so severe that it affects patients' daily activities or greatly decreases their quality of life. Patients who will benefit most from surgery are those whose shoulder bone (acromion) has a curve, hook, or bump (spur), or those who have a partial rotator cuff tear. There are 3 purposes of surgery. First, the inflamed bursa is removed. Second, the shoulder bone defect (curve, hook, spur) is removed. Third, the coracoacromial ligament is cut. This surgery is intended to reduce pain by increasing space in the area so that the rotator cuff is less likely to be pinched. °REASONS NOT TO OPERATE  °· Infection of the shoulder joint. °· Patient is unable or unwilling to complete the postoperative program. This includes keeping the shoulder in a sling or immobilizer (if open surgery is performed), or performing the needed rehabilitation. °· Emotional or psychological conditions that contribute to the shoulder condition. °· Patients who have rotator cuff inflammation due to other causes. This includes impingement caused by shoulder instability, weak shoulder blade muscles (scapula), shoulder arthritis, stiff or frozen shoulder, or a large os acromiale (failure of the shoulder bone growth plates to fuse properly). °RISKS AND COMPLICATIONS  °· Infection. °· Bleeding. °· Injury to nerves (numbness, weakness, paralysis). °· Continued or recurring pain. °· Detachment of the deltoid shoulder muscle (if open surgery is performed). °· Stiffness or loss of shoulder motion. °· Decrease in  athletic performance. °· Shoulder weakness. °· Fracture of the shoulder bone. °· Pain in the joint connecting the shoulder bone and collarbone. °· Removal of too much or too little shoulder bone. °TECHNIQUE °Technique used may vary between surgeons. In general, the surgery is performed with a flexible tube and tools inserted in a few small slits near the joint (arthroscopic). It may also be completed through an open cut (incision). The goal of the procedure is to remove the bursa, remove the shoulder bone deformity, and cut the coracoacromial ligament. Electricity will be used to sear the small capillaries (cauterize) to stop small amounts of bleeding. Other tools used are an electric or motorized shaver to remove the bursa, and a small power drill (burr) to remove the deformity of the shoulder bone.  °If the procedure is completed with an open incision, the surgeon will detach the deltoid shoulder muscle from the shoulder bone and cut the coracoacromial ligament. The deformity of the shoulder bone is then removed, using a saw or chisel (osteotome). A file (rasp) may be used to smooth the edges. Finally, the bursa is removed with scissors, and the deltoid muscle is reattached to the shoulder bone.  °HOME CARE INSTRUCTIONS  °· Postoperative care depends on the surgical technique used (arthroscopic or open). °· Follow the instructions given to you by your surgeon. °· Keep the wound clean and dry for 10 to 14 days after surgery, especially if open surgery is performed. °· Wear a sling, brace, or immobilizer as prescribed by your surgeon. This often lasts a couple days for arthroscopic procedures, or 6 to 8 weeks for open procedures, because the deltoid muscle must heal. °· You will be given pain medicines by your caregiver or surgeon. Take only as much medicine as   you need.  You may be advised to perform motion exercises immediately after surgery. These may be performed at home or with a therapist.  Postoperative  rehabilitation and exercises are very important to regain motion, and later, strength. RETURN TO SPORTS   6 weeks is the minimum waiting time required before returning to play. Open procedure surgeries are often longer.  Return to sports depends on the type of sport and the position played.  A therapist must assess your strength and range of motion before athletics may be resumed. SEEK MEDICAL CARE IF:   You experience pain, numbness, or coldness in the hand.  Blue, gray, or dark color appears in the fingernails.  Any of the following occur after surgery:  Increased pain, swelling, redness, drainage of fluids, or bleeding in the affected area.  Signs of infection (headache, muscle aches, dizziness, a general ill feeling with fever).  New, unexplained symptoms develop. (Drugs used in treatment may produce side effects.) Do not eat or drink anything before surgery. Solid food makes general anesthesia more hazardous.  Document Released: 05/15/2005 Document Revised: 09/29/2013 Document Reviewed: 08/27/2008 [facility] Patient Information 2015 [employer], [place]. This information is not intended to replace advice given to you by your health care provider. Make sure you discuss any questions you have with your health care provider.    Post Anesthesia Home Care Instructions  Activity: Get plenty of rest for the remainder of the day. A responsible adult should stay with you for 24 hours following the procedure.  For the next 24 hours, DO NOT: -Drive a car -Advertising copywriterperate machinery -Drink alcoholic beverages -Take any medication unless instructed by your physician -Make any legal decisions or sign important papers.  Meals: Start with liquid foods such as gelatin or soup. Progress to regular foods as tolerated. Avoid greasy, spicy, heavy foods. If nausea and/or vomiting occur, drink only clear liquids until the nausea and/or vomiting subsides. Call your physician if vomiting continues.  Special  Instructions/Symptoms: Your throat may feel dry or sore from the anesthesia or the breathing tube placed in your throat during surgery. If this causes discomfort, gargle with warm salt water. The discomfort should disappear within 24 hours.  Call your surgeon if you experience:   1.  Fever over 101.0. 2.  Inability to urinate. 3.  Nausea and/or vomiting. 4.  Extreme swelling or bruising at the surgical site. 5.  Continued bleeding from the incision. 6.  Increased pain, redness or drainage from the incision. 7.  Problems related to your pain medication. 8. Any change in color, movement and/or sensation 9. Any problems and/or concerns   Regional Anesthesia Blocks  1. Numbness or the inability to move the "blocked" extremity may last from 3-48 hours after placement. The length of time depends on the medication injected and your individual response to the medication. If the numbness is not going away after 48 hours, call your surgeon.  2. The extremity that is blocked will need to be protected until the numbness is gone and the  Strength has returned. Because you cannot feel it, you will need to take extra care to avoid injury. Because it may be weak, you may have difficulty moving it or using it. You may not know what position it is in without looking at it while the block is in effect.  3. For blocks in the legs and feet, returning to weight bearing and walking needs to be done carefully. You will need to wait until the numbness is entirely gone  and the strength has returned. You should be able to move your leg and foot normally before you try and bear weight or walk. You will need someone to be with you when you first try to ensure you do not fall and possibly risk injury.  4. Bruising and tenderness at the needle site are common side effects and will resolve in a few days.  5. Persistent numbness or new problems with movement should be communicated to the surgeon or the [facility] [phone])/ [facility] [phone]).

## 2014-04-20 NOTE — Progress Notes (Signed)
Assisted Dr. Rob Fitzgerald with right, ultrasound guided, interscalene  block. Side rails up, monitors on throughout procedure. See vital signs in flow sheet. Tolerated Procedure well. 

## 2014-04-20 NOTE — Anesthesia Postprocedure Evaluation (Signed)
  Anesthesia Post-op Note  Patient: April Hurst  Procedure(s) Performed: Procedure(s): RIGHT SHOULDER ARTHROSCOPY WITH DISTAL CLAVICAL EXCISION AND ACROMIOPLASTY  (Right)  Patient Location: PACU  Anesthesia Type:GA combined with regional for post-op pain  Level of Consciousness: awake, alert  and oriented  Airway and Oxygen Therapy: Patient Spontanous Breathing  Post-op Pain: none  Post-op Assessment: Post-op Vital signs reviewed  Post-op Vital Signs: Reviewed  Last Vitals:  Filed Vitals:   04/20/14 1642  BP: 163/97  Pulse: 97  Temp:   Resp: 16    Complications: No apparent anesthesia complications

## 2014-04-20 NOTE — Op Note (Signed)
Preoperative diagnosis: R shoulder impingement syndrome, a.c. joint arthritis   Postoperative diagnosis: Same   Procedure: R shoulder arthroscopic anterior-inferior acromioplasty, formal distal clavicle excision,   Surgeon: Feliberto GottronFrank J. Turner Danielsowan M.D.   First assistant: Allena KatzEric K Phillips PA-C, was present for entire procedure, was needed for retraction, operation of arthroscopic equipment, placement of dressing.  Anesthetic: R shoulder interscalene block plus general endotracheal   Estimated blood loss: Minimal   Fluid replacement: 1200 cc of crystalloid.   Indications for procedure: Patient with shoulder impingement syndrome and a.c. joint arthritis who is failed conservative measures with anti-inflammatory medicines, therapy and exercises, but did get temporary relief from a cortisone injection in the subacromial space. Because of increasing pain and weakness patient desires elective arthroscopic evaluation with acromioplasty, distal clavicle excision and we will also address any other intra-articular pathology. Risks and benefits of surgery were discussed prior to the procedure and all questions answered.   Description of procedure: Patient was identified by arm band and given preoperative IV antibiotics in the holding area, as well as interscalene block anesthetic.Marland Kitchen. Patient was taken to the operating room where the appropriate anesthetic monitors were attached and general endotracheal anesthesia was induced with the patient in the supine position. Patient was then placed in the beachchair position and the R upper extremity prepped and draped in the usual sterile fashion from the wrist to the hemithorax. A time out procedure was performed. We began the operation by making standard portals 1.5 cm anterior to the acromion, 1.5 cm lateral to the junction of the middle and posterior thirds of the acromion, and 1.5 cm posterior the posterior lateral corner of the acromion process. The inflow with gravity was  placed anteriorly, the arthroscope laterally, and a 4.2 great-white sucker shaver posteriorly. The subacromial bursa was resected and we visualized the subacromial spur which was then removed with a 4.5 hooded vortex bur making 2 passes. We then visualized the arthritic a.c. joint and the inferior distal centimeter of the clavicle was resected using a 4.5 hooded vortex bur from the posterior portal. We then brought the burr anteriorly, the scope posteriorly, and the inflow laterally completing the 1 cm distal clavicle excision. Moving into the glenohumeral joint the articular and the labral cartilages were visualized and the internal leaflets of the rotator cuff were normal, the labrum was normal, the biceps and biceps anchor were normal, and the articular cartilages were in excellent condition. At this point the shoulder was irrigated out normal saline solution the arthroscopic instruments removed and a dressing of Xerofoam 4 x 4 dressing sponges, paper tape, and sling applied the patient was then placed in supine awakened extubated and taken to the recovery without difficulty.

## 2014-04-20 NOTE — Anesthesia Procedure Notes (Addendum)
Anesthesia Regional Block:  Interscalene brachial plexus block  Pre-Anesthetic Checklist: ,, timeout performed, Correct Patient, Correct Site, Correct Laterality, Correct Procedure, Correct Position, site marked, Risks and benefits discussed,  Surgical consent,  Pre-op evaluation,  At surgeon's request and post-op pain management  Laterality: Right  Prep: chloraprep       Needles:  Injection technique: Single-shot  Needle Type: Echogenic Stimulator Needle     Needle Length: 5cm 5 cm Needle Gauge: 21 and 21 G    Additional Needles:  Procedures: ultrasound guided (picture in chart) and nerve stimulator Interscalene brachial plexus block Narrative:  Start time: 04/20/2014 1:40 PM End time: 04/20/2014 1:50 PM Injection made incrementally with aspirations every 5 mL.  Performed by: Personally  Anesthesiologist: Marcene DuosFITZGERALD, ROBERT E  Additional Notes: Risks benefits explained. Pt tolerated the procedure well with no immediate complications.   Procedure Name: Intubation Date/Time: 04/20/2014 2:43 PM Performed by: Burna CashONRAD, Seldon Barrell C Pre-anesthesia Checklist: Patient identified, Emergency Drugs available, Suction available and Patient being monitored Patient Re-evaluated:Patient Re-evaluated prior to inductionOxygen Delivery Method: Circle System Utilized Preoxygenation: Pre-oxygenation with 100% oxygen Intubation Type: IV induction Ventilation: Mask ventilation without difficulty Laryngoscope Size: Mac and 3 Grade View: Grade I Tube type: Oral Number of attempts: 1 Airway Equipment and Method: stylet and oral airway Placement Confirmation: ETT inserted through vocal cords under direct vision,  positive ETCO2 and breath sounds checked- equal and bilateral Secured at: 20 cm Tube secured with: Tape Dental Injury: Teeth and Oropharynx as per pre-operative assessment

## 2014-04-20 NOTE — Transfer of Care (Signed)
Immediate Anesthesia Transfer of Care Note  Patient: April Hurst  Procedure(s) Performed: Procedure(s): RIGHT SHOULDER ARTHROSCOPY WITH DISTAL CLAVICAL EXCISION AND ACROMIOPLASTY  (Right)  Patient Location: PACU  Anesthesia Type:GA combined with regional for post-op pain  Level of Consciousness: awake, alert  and oriented  Airway & Oxygen Therapy: Patient Spontanous Breathing and Patient connected to face mask oxygen  Post-op Assessment: Report given to PACU RN and Post -op Vital signs reviewed and stable  Post vital signs: Reviewed and stable  Complications: No apparent anesthesia complications

## 2014-04-20 NOTE — Anesthesia Preprocedure Evaluation (Addendum)
Anesthesia Evaluation  Patient identified by MRN, date of birth, ID band Patient awake    Reviewed: Allergy & Precautions, H&P , NPO status , Patient's Chart, lab work & pertinent test results, Unable to perform ROS - Chart review only  Airway Mallampati: II  TM Distance: >3 FB Neck ROM: Full    Dental no notable dental hx. (+) Teeth Intact, Dental Advisory Given   Pulmonary neg pulmonary ROS,  breath sounds clear to auscultation  Pulmonary exam normal       Cardiovascular negative cardio ROS  Rhythm:Regular Rate:Normal     Neuro/Psych negative neurological ROS  negative psych ROS   GI/Hepatic Neg liver ROS, GERD-  Medicated and Controlled,  Endo/Other  negative endocrine ROS  Renal/GU negative Renal ROS  negative genitourinary   Musculoskeletal  (+) Arthritis -,   Abdominal   Peds  Hematology negative hematology ROS (+)   Anesthesia Other Findings   Reproductive/Obstetrics negative OB ROS                            Anesthesia Physical  Anesthesia Plan  ASA: II  Anesthesia Plan: General   Post-op Pain Management:    Induction: Intravenous  Airway Management Planned: Oral ETT  Additional Equipment:   Intra-op Plan:   Post-operative Plan: Extubation in OR  Informed Consent: I have reviewed the patients History and Physical, chart, labs and discussed the procedure including the risks, benefits and alternatives for the proposed anesthesia with the patient or authorized representative who has indicated his/her understanding and acceptance.   Dental advisory given  Plan Discussed with: CRNA  Anesthesia Plan Comments:         Anesthesia Quick Evaluation

## 2014-10-29 DIAGNOSIS — M5430 Sciatica, unspecified side: Secondary | ICD-10-CM | POA: Insufficient documentation

## 2014-10-29 DIAGNOSIS — G43909 Migraine, unspecified, not intractable, without status migrainosus: Secondary | ICD-10-CM | POA: Insufficient documentation

## 2014-10-29 DIAGNOSIS — F410 Panic disorder [episodic paroxysmal anxiety] without agoraphobia: Secondary | ICD-10-CM | POA: Insufficient documentation

## 2014-10-29 DIAGNOSIS — M797 Fibromyalgia: Secondary | ICD-10-CM | POA: Insufficient documentation

## 2014-10-29 DIAGNOSIS — I10 Essential (primary) hypertension: Secondary | ICD-10-CM | POA: Insufficient documentation

## 2014-10-29 DIAGNOSIS — R748 Abnormal levels of other serum enzymes: Secondary | ICD-10-CM | POA: Insufficient documentation

## 2014-10-29 DIAGNOSIS — M5030 Other cervical disc degeneration, unspecified cervical region: Secondary | ICD-10-CM | POA: Insufficient documentation

## 2014-10-29 DIAGNOSIS — M5136 Other intervertebral disc degeneration, lumbar region: Secondary | ICD-10-CM | POA: Insufficient documentation

## 2014-10-29 DIAGNOSIS — E785 Hyperlipidemia, unspecified: Secondary | ICD-10-CM | POA: Insufficient documentation

## 2014-11-05 DIAGNOSIS — J189 Pneumonia, unspecified organism: Secondary | ICD-10-CM

## 2014-11-05 HISTORY — DX: Pneumonia, unspecified organism: J18.9

## 2014-11-09 ENCOUNTER — Other Ambulatory Visit (HOSPITAL_COMMUNITY): Payer: Self-pay | Admitting: Interventional Radiology

## 2014-11-09 ENCOUNTER — Other Ambulatory Visit: Payer: Self-pay | Admitting: *Deleted

## 2014-11-09 DIAGNOSIS — I728 Aneurysm of other specified arteries: Secondary | ICD-10-CM

## 2014-11-23 LAB — CBC
HCT: 35.3 % — ABNORMAL LOW (ref 36.0–46.0)
Hemoglobin: 11.4 g/dL — ABNORMAL LOW (ref 12.0–15.0)
MCH: 27.9 pg (ref 26.0–34.0)
MCHC: 32.3 g/dL (ref 30.0–36.0)
MCV: 86.5 fL (ref 78.0–100.0)
MPV: 8.3 fL — AB (ref 8.6–12.4)
PLATELETS: 589 10*3/uL — AB (ref 150–400)
RBC: 4.08 MIL/uL (ref 3.87–5.11)
RDW: 15.1 % (ref 11.5–15.5)
WBC: 10.4 10*3/uL (ref 4.0–10.5)

## 2014-11-24 LAB — COMPLETE METABOLIC PANEL WITH GFR
ALBUMIN: 4.2 g/dL (ref 3.5–5.2)
ALT: 17 U/L (ref 0–35)
AST: 14 U/L (ref 0–37)
Alkaline Phosphatase: 183 U/L — ABNORMAL HIGH (ref 39–117)
BUN: 15 mg/dL (ref 6–23)
CALCIUM: 9.8 mg/dL (ref 8.4–10.5)
CO2: 29 mEq/L (ref 19–32)
Chloride: 102 mEq/L (ref 96–112)
Creat: 0.91 mg/dL (ref 0.50–1.10)
GFR, Est African American: 84 mL/min
GFR, Est Non African American: 73 mL/min
Glucose, Bld: 102 mg/dL — ABNORMAL HIGH (ref 70–99)
POTASSIUM: 4 meq/L (ref 3.5–5.3)
SODIUM: 141 meq/L (ref 135–145)
TOTAL PROTEIN: 7.2 g/dL (ref 6.0–8.3)
Total Bilirubin: 0.4 mg/dL (ref 0.2–1.2)

## 2014-11-26 ENCOUNTER — Ambulatory Visit
Admission: RE | Admit: 2014-11-26 | Discharge: 2014-11-26 | Disposition: A | Discharge status: Home / Self Care | Payer: No Typology Code available for payment source | Source: Ambulatory Visit | Attending: Interventional Radiology | Admitting: Interventional Radiology

## 2014-11-26 DIAGNOSIS — I728 Aneurysm of other specified arteries: Secondary | ICD-10-CM

## 2014-11-26 HISTORY — DX: Depression, unspecified: F32.A

## 2014-11-26 HISTORY — DX: Major depressive disorder, single episode, unspecified: F32.9

## 2014-11-26 HISTORY — DX: Headache: R51

## 2014-11-26 HISTORY — DX: Headache, unspecified: R51.9

## 2014-11-26 HISTORY — DX: Fibromyalgia: M79.7

## 2014-11-26 HISTORY — DX: Pneumonia, unspecified organism: J18.9

## 2014-11-26 HISTORY — DX: Essential (primary) hypertension: I10

## 2014-11-26 NOTE — Progress Notes (Signed)
Chief Complaint: Pseudoaneurysm requiring embolization following US guided liver biopsy  Referring Physician(s): Shriley Joffe  History of Present Illness: April Hurst is a 54 y.o. female who presents for follow-up after she sustained a complication from an ultrasound guided core biopsy of the liver. On 11/06/2014 she underwent ultrasound-guided random liver biopsy performed by myself at [facility]. The indication was persistently elevated liver enzymes.  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 This was noted during the biopsy procedure under real-time sonographic imaging. The patient was then taken urgently to CT scan where an enlarging pseudoaneurysm and developing hemoperitoneum was noted. The patient was then taken emergently across the hall and hepatic arteriogram and embolization of the injured 1-2 millimeter peripheral branch artery was accomplished.  The patient was admitted for observation and was able to be discharged the following day. No transfusion was required. She presents today for scheduled follow-up evaluation.  Today, April Hurst is feeling much better. She has some mild residual tenderness in the right upper quadrant. Additionally, she notes some bloating and feelings of early satiety while eating. She finds that she has to eat in small volumes. She denies fever, chills, diarrhea, nausea or vomiting. No lightheadedness, dizziness or syncope.  Her gastroenterologist who ordered the biopsy has changed practices and no longer works in [employer]. She has not received any results from her hepatic biopsy.  Past Medical History  Diagnosis Date  . High cholesterol   . Arthritis     back  . GERD (gastroesophageal reflux disease)   . Chronic back pain     neuropathy LLE; cervical and lumbar bulging discs, per pt  . 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  Pneumonia 11/05/2014  . Fibromyalgia   . Headache     migraines  . Depression   . Hypertension     Past Surgical History  Procedure Laterality Date  . Abdominal hysterectomy    . Tonsillectomy    . Cholecystectomy    . Abdominal hysterectomy  1990s    partial  . Tonsillectomy and adenoidectomy  1990s  . Cholecystectomy  2012  . Carpal tunnel release Bilateral 2015     one 10/2013; one 12/2013  . Knee arthroscopy Right 03/23/2014    Procedure: RIGHT ARTHROSCOPY KNEE;  Surgeon: Nestor Lewandowsky, MD;  Location: [facility];  Service: Orthopedics;  Laterality: Right;    Allergies: Review of patient's allergies indicates no known allergies.  Medications: Prior to Admission medications   Medication Sig Start Date End Date Taking? Authorizing Provider  DULoxetine (CYMBALTA) 20 MG capsule Take 20 mg by mouth daily.   Yes Historical Provider, MD  gabapentin (NEURONTIN) 300 MG capsule Take 300 mg by mouth 3 (three) times daily. 300 mg. In AM, 300 mg. At noon, and 900 mg. At night   Yes Historical Provider, MD  aspirin 81 MG chewable tablet Chew 81 mg by mouth daily.    Historical Provider, MD  atorvastatin (LIPITOR) 20 MG tablet Take 20 mg by mouth.    Historical Provider, MD  Cholecalciferol (VITAMIN D-3) 5000 UNITS TABS Take 10,000 Units by mouth daily.    Historical Provider, MD  HYDROcodone-acetaminophen (NORCO) 5-325 MG per tablet Take 1-2 tablets by mouth every 6 (six) hours as needed for moderate pain. Patient not taking: Reported on 11/26/2014 04/20/14   Allena Katz, PA-C  Multiple Vitamin (MULTIVITAMIN) tablet Take 1 tablet by  mouth daily.    Historical Provider, MD  oxycodone (OXY-IR) 5 MG capsule Take 5 mg by mouth.    Historical Provider, MD  pantoprazole (PROTONIX) 40 MG tablet Take 40 mg by mouth 2 (two) times daily.    Historical Provider, MD  traMADol (ULTRAM) 50 MG tablet Take 1 tablet (50 mg total) by mouth every 6 (six) hours as needed for pain. Patient not  taking: Reported on 11/26/2014 02/29/12   Geoffery Lyonsouglas Delo, MD  vitamin C (ASCORBIC ACID) 500 MG tablet Take 500 mg by mouth daily.    Historical Provider, MD  zonisamide (ZONEGRAN) 50 MG capsule Take 200 mg by mouth.    Historical Provider, MD     No family history on file.  History   Social History  . Marital Status: Legally Separated    Spouse Name: N/A  . Number of Children: N/A  . Years of Education: N/A   Social History Main Topics  . Smoking status: Never Smoker   . Smokeless tobacco: Never Used  . Alcohol Use: No  . Drug Use: No  . Sexual Activity: Yes    Birth Control/ Protection: Surgical   Other Topics Concern  . None   Social History Narrative   ** Merged History Encounter **        Review of Systems: A 12 point ROS discussed and pertinent positives are indicated in the HPI above.  All other systems are negative.  Review of Systems  Vital Signs: BP 149/87 mmHg  Pulse 74  Temp(Src) 97.8 F (36.6 C)  Resp 16  SpO2 100%  Physical Exam  Constitutional: She appears well-developed and well-nourished. No distress.  HENT:  Head: Normocephalic and atraumatic.  Eyes: No scleral icterus.  Cardiovascular: Normal rate.   Pulmonary/Chest: Effort normal.  Abdominal: Soft. She exhibits no mass. There is no hepatosplenomegaly. There is tenderness in the right upper quadrant. There is no rigidity, no rebound and no guarding.    Nursing note and vitals reviewed.    Imaging: No results found.  Labs:  CBC:  Recent Labs  03/23/14 1147 04/20/14 1242 11/23/14 1011  WBC  --   --  10.4  HGB 14.2 13.6 11.4*  HCT  --   --  35.3*  PLT  --   --  589*    COAGS: No results for input(s): INR, APTT in the last 8760 hours.  BMP:  Recent Labs  11/23/14 1011  NA 141  K 4.0  CL 102  CO2 29  GLUCOSE 102*  BUN 15  CALCIUM 9.8  CREATININE 0.91  GFRNONAA 73  GFRAA 84    LIVER FUNCTION TESTS:  Recent Labs  11/23/14 1011  BILITOT 0.4  AST 14  ALT 17    ALKPHOS 183*  PROT 7.2  ALBUMIN 4.2    TUMOR MARKERS: No results for input(s): AFPTM, CEA, CA199, CHROMGRNA in the last 8760 hours.  Assessment and Plan:  April Hurst has nearly fully recovered following her severe hemorrhage following ultrasound-guided random liver biopsy.  Her intrahepatic hematoma is improving by ultrasound and demonstrates no evidence of residual pseudoaneurysm.  We were able to get a report of the results of her liver biopsy which showed mild steatosis and inflammation. Her primary gastroenterologist has changed practices. She is in the process of contacting a new gastroenterologist who will review these results further.  Her symptoms of bloating and partial satiety while eating may be related to stress induced gastritis. I recommended that she try Prilosec OTC.  I will reassess the symptoms when she returns to see me.  1.) Return to clinic in 3 months with repeat liver ultrasound and repeat CBC and CMP.   SignedMalachy Moan 11/26/2014, 10:48 AM   I spent a total of  15 Minutes in face to face in clinical consultation, greater than 50% of which was counseling/coordinating care for post liver biopsy bleeding.

## 2015-03-10 ENCOUNTER — Other Ambulatory Visit: Payer: Self-pay | Admitting: *Deleted

## 2015-03-10 ENCOUNTER — Other Ambulatory Visit (HOSPITAL_COMMUNITY): Payer: Self-pay | Admitting: Interventional Radiology

## 2015-03-10 DIAGNOSIS — I728 Aneurysm of other specified arteries: Secondary | ICD-10-CM

## 2015-03-10 DIAGNOSIS — K7689 Other specified diseases of liver: Secondary | ICD-10-CM

## 2015-03-12 LAB — CBC
HCT: 35.6 % — ABNORMAL LOW (ref 36.0–46.0)
Hemoglobin: 12 g/dL (ref 12.0–15.0)
MCH: 28 pg (ref 26.0–34.0)
MCHC: 33.7 g/dL (ref 30.0–36.0)
MCV: 83.2 fL (ref 78.0–100.0)
MPV: 8.7 fL (ref 8.6–12.4)
PLATELETS: 382 10*3/uL (ref 150–400)
RBC: 4.28 MIL/uL (ref 3.87–5.11)
RDW: 15.1 % (ref 11.5–15.5)
WBC: 8.3 10*3/uL (ref 4.0–10.5)

## 2015-03-12 LAB — COMPLETE METABOLIC PANEL WITH GFR
ALBUMIN: 4 g/dL (ref 3.6–5.1)
ALT: 15 U/L (ref 6–29)
AST: 15 U/L (ref 10–35)
Alkaline Phosphatase: 148 U/L — ABNORMAL HIGH (ref 33–130)
BILIRUBIN TOTAL: 0.3 mg/dL (ref 0.2–1.2)
BUN: 13 mg/dL (ref 7–25)
CO2: 25 mmol/L (ref 20–31)
CREATININE: 0.99 mg/dL (ref 0.50–1.05)
Calcium: 9.5 mg/dL (ref 8.6–10.4)
Chloride: 106 mmol/L (ref 98–110)
GFR, Est African American: 76 mL/min (ref >=60)
GFR, Est Non African American: 66 mL/min (ref >=60)
Glucose, Bld: 91 mg/dL (ref 65–99)
Potassium: 4.1 mmol/L (ref 3.5–5.3)
SODIUM: 142 mmol/L (ref 135–146)
Total Protein: 6.4 g/dL (ref 6.1–8.1)

## 2015-03-17 ENCOUNTER — Ambulatory Visit
Admission: RE | Admit: 2015-03-17 | Discharge: 2015-03-17 | Disposition: A | Discharge status: Home / Self Care | Payer: No Typology Code available for payment source | Source: Ambulatory Visit | Attending: Interventional Radiology | Admitting: Interventional Radiology

## 2015-03-17 DIAGNOSIS — I728 Aneurysm of other specified arteries: Secondary | ICD-10-CM

## 2015-03-17 NOTE — Progress Notes (Signed)
Chief Complaint: Patient was seen in consultation today for iatrogenic hepatic artery pseudoaneurysm at the request of McCullough,Heath  Referring Physician(s): McCullough,Heath  History of Present Illness: April Hurst is a 54 y.o. female who presents for follow-up after she sustained a complication from an ultrasound guided core biopsy of the liver. On 11/06/2014 she underwent ultrasound-guided random liver biopsy performed by myself at [facility]. The indication was persistently elevated liver enzymes.  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 This was noted during the biopsy procedure under real-time sonographic imaging. The patient was then taken urgently to CT scan where an enlarging pseudoaneurysm and developing hemoperitoneum was noted. The patient was then taken emergently across the hall and hepatic arteriogram and embolization of the injured 1-2 millimeter peripheral branch artery was accomplished.  The patient was admitted for observation and was able to be discharged the following day. No transfusion was required. She presents today for scheduled follow-up evaluation.  Today, she is completely asymptomatic. She feels back at her baseline and has no complaints.  Past Medical History  Diagnosis Date  . High cholesterol   . Arthritis     back  . GERD (gastroesophageal reflux disease)   . Chronic back pain     neuropathy LLE; cervical and lumbar bulging discs, per pt  . 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 Pneumonia 11/05/2014  . Fibromyalgia   . Headache     migraines  . Depression   . Hypertension     Past Surgical History  Procedure Laterality Date  . Abdominal hysterectomy    . Tonsillectomy    . Cholecystectomy    . Abdominal hysterectomy  1990s    partial  . Tonsillectomy and adenoidectomy  1990s  . Cholecystectomy  2012  . Carpal tunnel  release Bilateral 2015     one 10/2013; one 12/2013  . Knee arthroscopy Right 03/23/2014    Procedure: RIGHT ARTHROSCOPY KNEE;  Surgeon: Nestor LewandowskyFrank J Rowan, MD;  Location: [facility];  Service: Orthopedics;  Laterality: Right;    Allergies: Review of patient's allergies indicates no known allergies.  Medications: Prior to Admission medications   Medication Sig Start Date End Date Taking? Authorizing Provider  aspirin 81 MG chewable tablet Chew 81 mg by mouth daily.    Historical Provider, MD  atorvastatin (LIPITOR) 20 MG tablet Take 20 mg by mouth.    Historical Provider, MD  Cholecalciferol (VITAMIN D-3) 5000 UNITS TABS Take 10,000 Units by mouth daily.    Historical Provider, MD  DULoxetine (CYMBALTA) 20 MG capsule Take 20 mg by mouth daily.    Historical Provider, MD  gabapentin (NEURONTIN) 300 MG capsule Take 300 mg by mouth 3 (three) times daily. 300 mg. In AM, 300 mg. At noon, and 900 mg. At night    Historical Provider, MD  HYDROcodone-acetaminophen (NORCO) 5-325 MG per tablet Take 1-2 tablets by mouth every 6 (six) hours as needed for moderate pain. Patient not taking: Reported on 11/26/2014 04/20/14   Allena KatzEric K Phillips, PA-C  Multiple Vitamin (MULTIVITAMIN) tablet Take 1 tablet by mouth daily.    Historical Provider, MD  oxycodone (OXY-IR) 5 MG capsule Take 5 mg by mouth.    Historical Provider, MD  pantoprazole (PROTONIX) 40 MG tablet Take 40 mg by mouth 2 (two) times daily.    Historical Provider, MD  traMADol (ULTRAM) 50 MG tablet Take 1 tablet (50 mg  total) by mouth every 6 (six) hours as needed for pain. Patient not taking: Reported on 11/26/2014 02/29/12   Geoffery Lyons, MD  vitamin C (ASCORBIC ACID) 500 MG tablet Take 500 mg by mouth daily.    Historical Provider, MD  zonisamide (ZONEGRAN) 50 MG capsule Take 200 mg by mouth.    Historical Provider, MD     No family history on file.  Social History   Social History  . Marital Status: Legally Separated    Spouse  Name: N/A  . Number of Children: N/A  . Years of Education: N/A   Social History Main Topics  . Smoking status: Never Smoker   . Smokeless tobacco: Never Used  . Alcohol Use: No  . Drug Use: No  . Sexual Activity: Yes    Birth Control/ Protection: Surgical   Other Topics Concern  . Not on file   Social History Narrative   ** Merged History Encounter **        Review of Systems: A 12 point ROS discussed and pertinent positives are indicated in the HPI above.  All other systems are negative.  Review of Systems  Vital Signs: There were no vitals taken for this visit.  Physical Exam  Constitutional: She is oriented to person, place, and time. She appears well-developed and well-nourished. No distress.  HENT:  Head: Normocephalic and atraumatic.  Eyes: No scleral icterus.  Cardiovascular: Normal rate.   Pulmonary/Chest: Effort normal.  Abdominal: Soft. She exhibits no distension. There is no tenderness.  Neurological: She is alert and oriented to person, place, and time.  Skin: Skin is warm and dry.  Psychiatric: She has a normal mood and affect. Her behavior is normal.  Nursing note and vitals reviewed.    Imaging: No results found.  Labs:  CBC:  Recent Labs  03/23/14 1147 04/20/14 1242 11/23/14 1011 03/10/15 0821  WBC  --   --  10.4 8.3  HGB 14.2 13.6 11.4* 12.0  HCT  --   --  35.3* 35.6*  PLT  --   --  589* 382    COAGS: No results for input(s): INR, APTT in the last 8760 hours.  BMP:  Recent Labs  11/23/14 1011 03/10/15 0821  NA 141 142  K 4.0 4.1  CL 102 106  CO2 29 25  GLUCOSE 102* 91  BUN 15 13  CALCIUM 9.8 9.5  CREATININE 0.91 0.99  GFRNONAA 73 66  GFRAA 84 76    LIVER FUNCTION TESTS:  Recent Labs  11/23/14 1011 03/10/15 0821  BILITOT 0.4 0.3  AST 14 15  ALT 17 15  ALKPHOS 183* 148*  PROT 7.2 6.4  ALBUMIN 4.2 4.0    TUMOR MARKERS: No results for input(s): AFPTM, CEA, CA199, CHROMGRNA in the last 8760  hours.  Assessment and Plan:  Doing exceptionally well status post coil embolization of actively bleeding hepatic artery pseudoaneurysm sustained as a complication of random liver biopsy.  Ultrasound evaluation today demonstrates decreasing size of the intrahepatic hematoma. No evidence of residual pseudoaneurysm.  She is completely asymptomatic and no longer has any abdominal or right upper quadrant pain.  1.) No further follow-up required.   SignedMalachy Moan 03/17/2015, 3:47 PM   I spent a total of  10 Minutes in face to face in clinical consultation, greater than 50% of which was counseling/coordinating care for hepatic artery pseudoaneurysm.

## 2015-12-01 IMAGING — US US ABDOMEN LIMITED
1 series · 14 of 25 positions shown · non-contrast
Comparison: Prior ultrasound 11/26/2014

CLINICAL DATA: 54-year-old female with a history of iatrogenic
hepatic artery pseudoaneurysm treated with emergent coil
embolization. Follow-up intrahepatic hemorrhage and pseudoaneurysm.

EXAM:
LIMITED ABDOMINAL ULTRASOUND

[Series 1: us abdomen limited · 0.37mm/px · 14 of 36 slices shown]
[im 1/36]
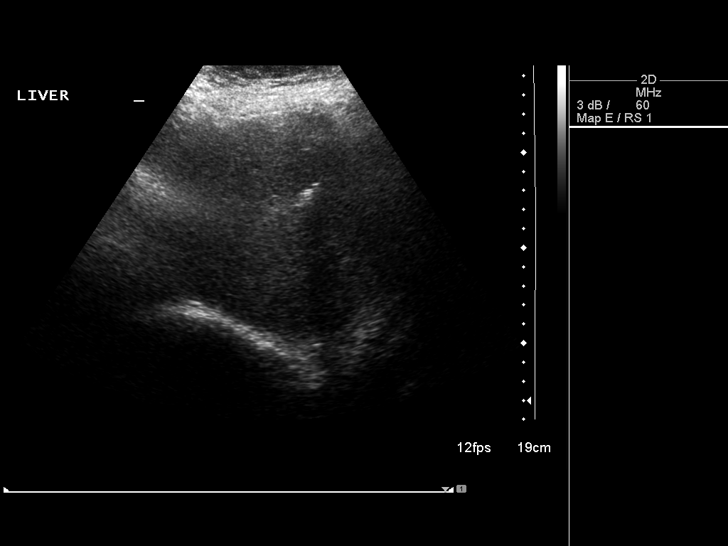
[im 3/36]
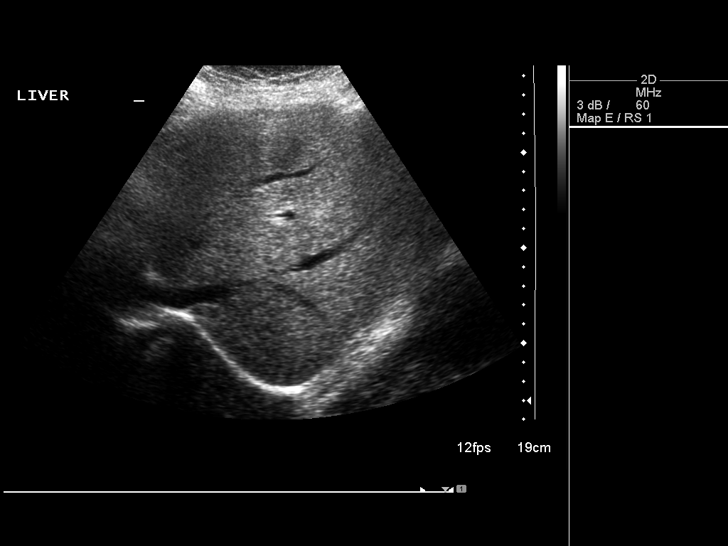
[im 6/36]
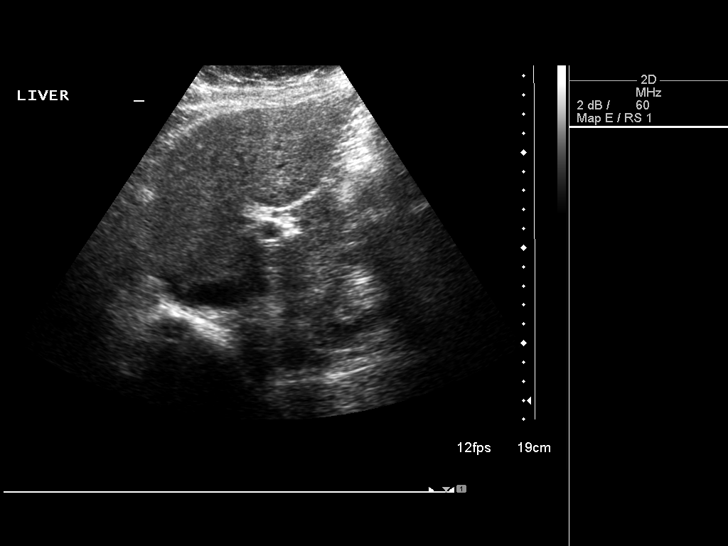
[im 9/36]
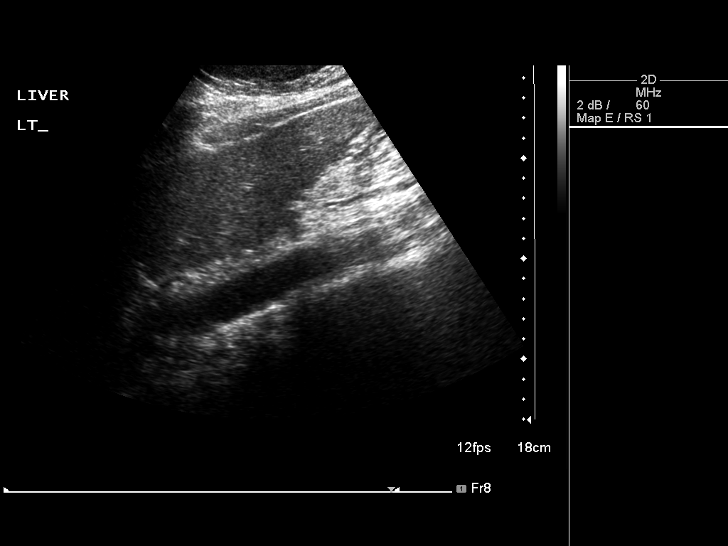
[im 12/36]
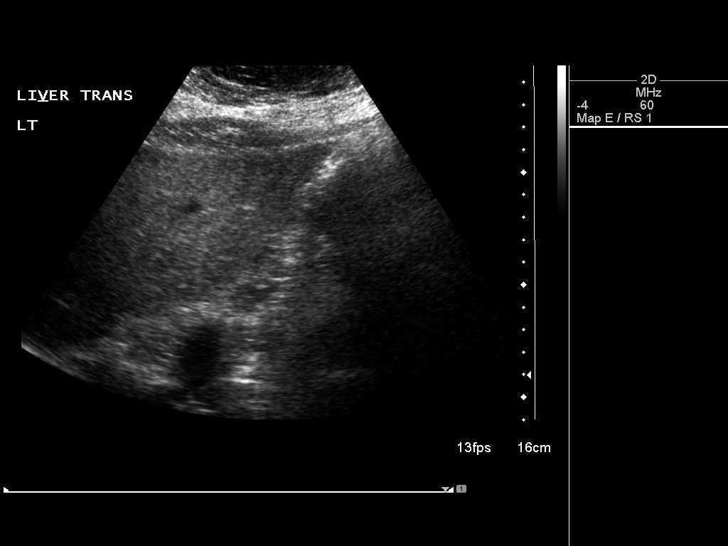
[im 14/36]
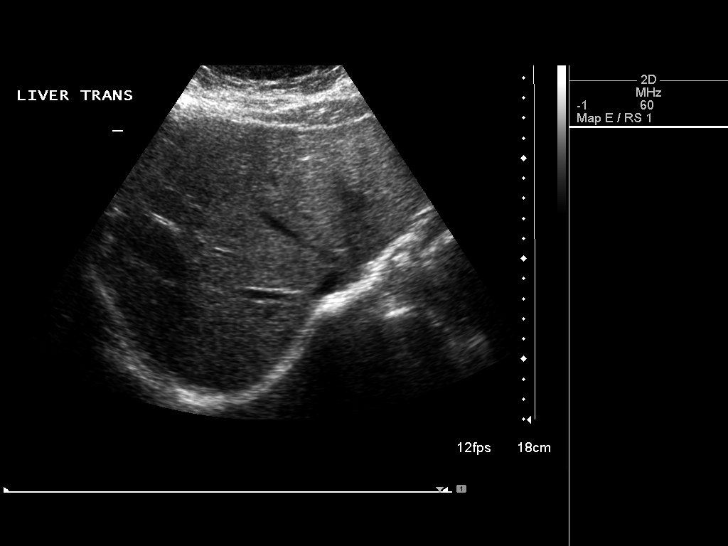
[im 17/36]
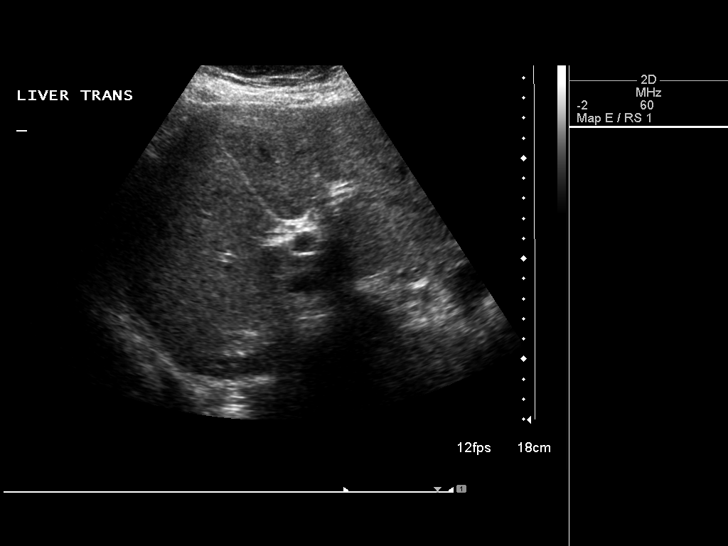
[im 19/36]
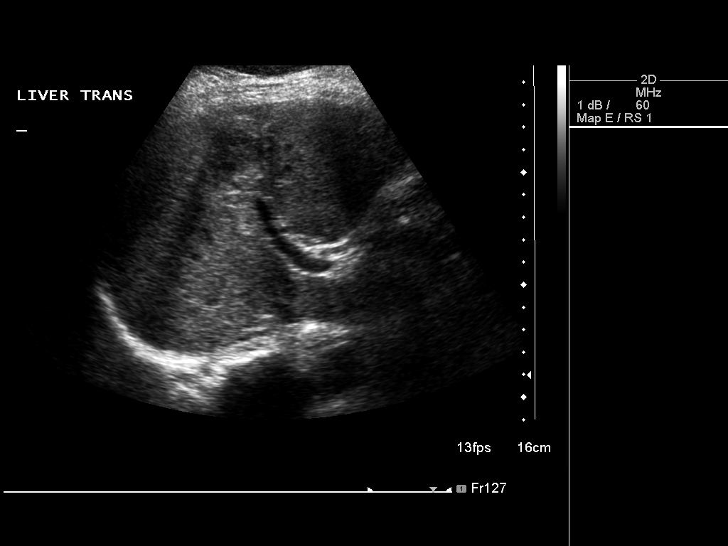
[im 22/36]
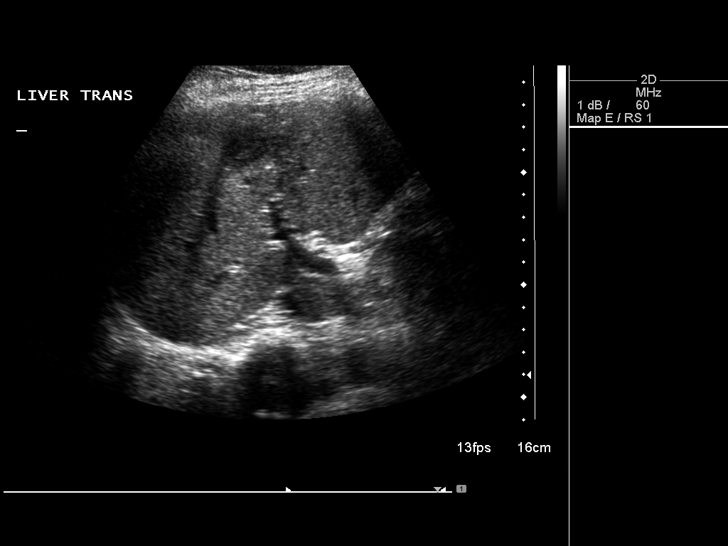
[im 24/36]
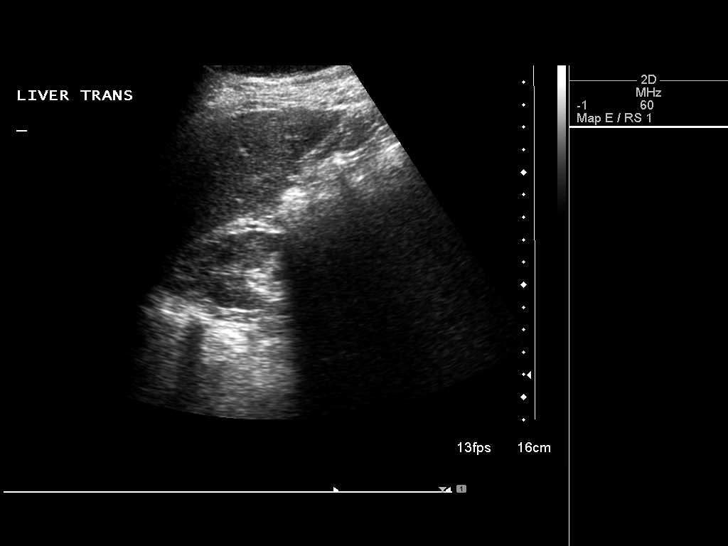
[im 27/36]
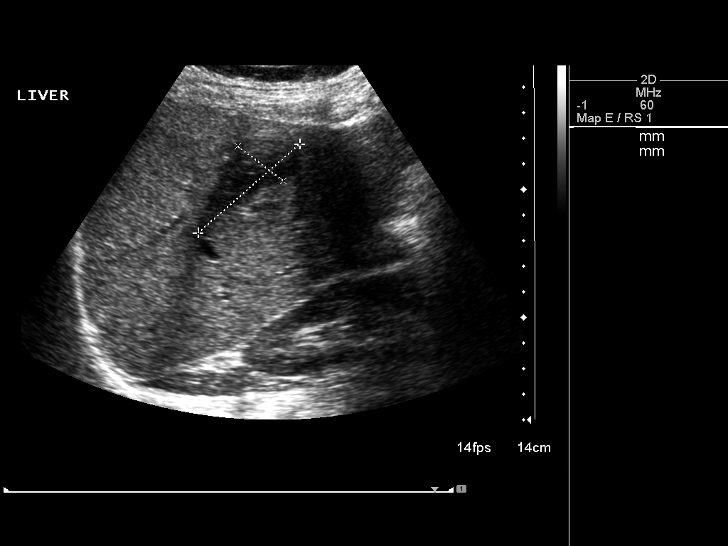
[im 30/36]
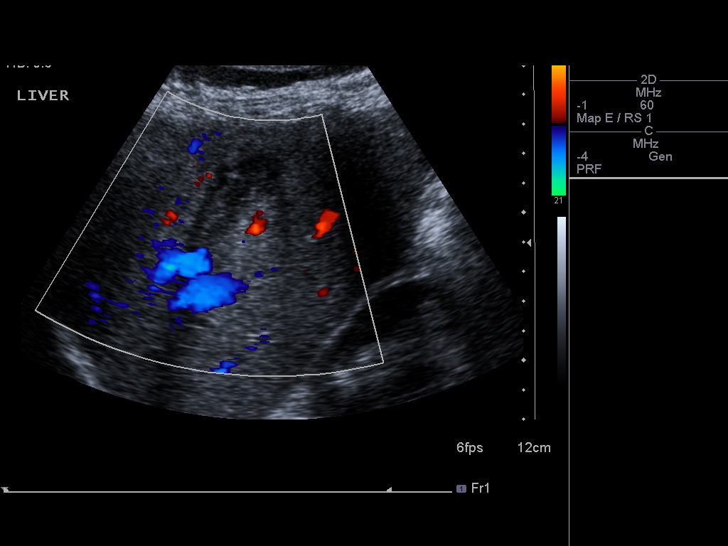
[im 33/36]
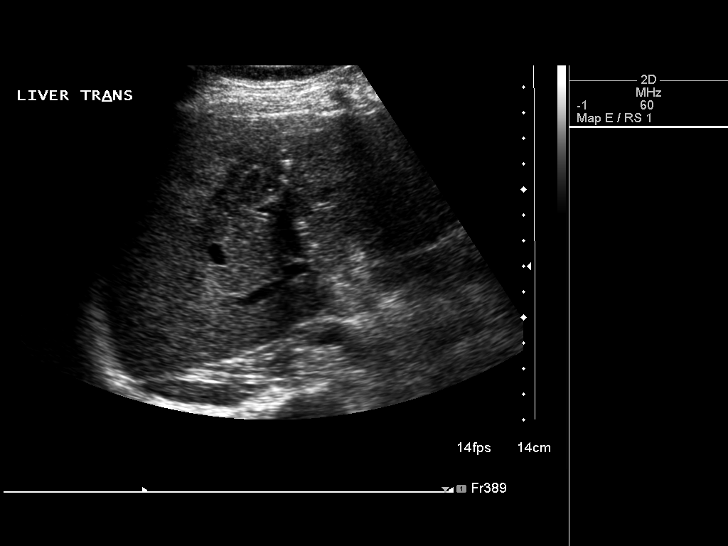
[im 36/36]
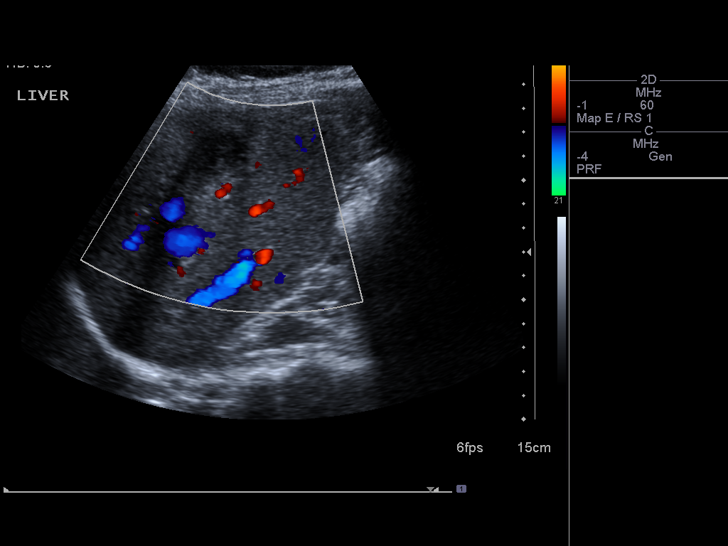

[14 of 25 positions shown; findings below may reference images not displayed]

FINDINGS: Sonographic interrogation of the liver demonstrates a patent portal
vein with normal hepatopetal flow. The previously noted
heterogeneous hypoechoic mass within the right liver has
significantly decreased in size presently measuring 5.3 x 2.2 x
cm compared to 7.9 x 5.4 x 9.4 cm.

There is no evidence of internal vascularity. Metallic artifacts
within the hepatic parenchyma likely represents the coil pack in the
intrahepatic artery which was coil embolized. No evidence of ascites
or subcapsular hematoma.
IMPRESSION: 1. Resolving intraparenchymal hematoma.
2. No evidence of residual pseudoaneurysm.

## 2019-01-20 ENCOUNTER — Other Ambulatory Visit: Payer: Self-pay | Admitting: Registered Nurse

## 2019-01-20 DIAGNOSIS — S32030G Wedge compression fracture of third lumbar vertebra, subsequent encounter for fracture with delayed healing: Secondary | ICD-10-CM

## 2019-01-21 ENCOUNTER — Ambulatory Visit
Admission: RE | Admit: 2019-01-21 | Discharge: 2019-01-21 | Disposition: A | Discharge status: Home / Self Care | Payer: No Typology Code available for payment source | Source: Ambulatory Visit | Attending: Registered Nurse | Admitting: Registered Nurse

## 2019-01-21 ENCOUNTER — Other Ambulatory Visit: Payer: Self-pay

## 2019-01-21 ENCOUNTER — Encounter: Payer: Self-pay | Admitting: *Deleted

## 2019-01-21 DIAGNOSIS — S32030G Wedge compression fracture of third lumbar vertebra, subsequent encounter for fracture with delayed healing: Secondary | ICD-10-CM

## 2019-01-21 HISTORY — PX: IR RADIOLOGIST EVAL & MGMT: IMG5224

## 2019-01-21 NOTE — Consult Note (Signed)
Chief Complaint: Symptomatic L3 compression fracture  Referring Physician(s): Papineau,April M  History of Present Illness: April Hurst is a 58 y.o. female with past medical history significant for hypertension, hyperlipidemia and chronic low back pain who experienced a fall on July 4th with acute worsening of her pre-existing chronic low back pain.  The patient was seen in consultation via telemedicine for evaluation of symptomatic L3 compression fracture demonstrated on lumbar spine MRI performed 12/20/2018.  The patient reports a long history of low back pain previously requiring several years of oral prednisone.  Patient states that her pre-existing back pain was diffuse and unrelenting.  Unfortunately, the patient experienced a fall on some grass July 4th while chasing after her granddaughter, since which time she has experienced the acute worsening of her low back pain, currently requiring Percocets 10/325 on a scheduled every 6 hours regimen.    The patient states that her pain regimen nearly completely alleviates her back pain, however without her pain medication her back pain is estimated to be approximately 10 out of 10 in severity.    The patient states that her pain is unchanged to slightly improved since the time of her fall in early July.  The patient denies lower extremity weakness or paresthesias.  No change in bowel or bladder function.    Past Medical History:  Diagnosis Date  . Arthritis    back  . 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 Chronic back pain    neuropathy LLE; cervical and lumbar bulging discs, per pt  . Depression   . Fibromyalgia   . GERD (gastroesophageal reflux disease)   . Headache    migraines  . High cholesterol   . Hypertension   . Pneumonia 11/05/2014    Past Surgical History:  Procedure Laterality Date  . ABDOMINAL HYSTERECTOMY    . ABDOMINAL HYSTERECTOMY  1990s   partial  . CARPAL TUNNEL RELEASE Bilateral 2015    one  10/2013; one 12/2013  . CHOLECYSTECTOMY    . CHOLECYSTECTOMY  2012  . KNEE ARTHROSCOPY Right 03/23/2014   Procedure: RIGHT ARTHROSCOPY KNEE;  Surgeon: Nestor Lewandowsky, MD;  Location: [facility];  Service: Orthopedics;  Laterality: Right;  . TONSILLECTOMY    . TONSILLECTOMY AND ADENOIDECTOMY  1990s    Allergies: Patient has no known allergies.  Medications: Prior to Admission medications   Medication Sig Start Date End Date Taking? Authorizing Provider  aspirin 81 MG chewable tablet Chew 81 mg by mouth daily.    [provider]  atorvastatin (LIPITOR) 20 MG tablet Take 20 mg by mouth.    [provider]  Cholecalciferol (VITAMIN D-3) 5000 UNITS TABS Take 10,000 Units by mouth daily.    [provider]  DULoxetine (CYMBALTA) 20 MG capsule Take 20 mg by mouth daily.    [provider]  gabapentin (NEURONTIN) 300 MG capsule Take 300 mg by mouth 3 (three) times daily. 300 mg. In AM, 300 mg. At noon, and 900 mg. At night    [provider]  HYDROcodone-acetaminophen (NORCO) 5-325 MG per tablet Take 1-2 tablets by mouth every 6 (six) hours as needed for moderate pain. Patient not taking: Reported on 11/26/2014 04/20/14   Allena Katz, PA-C  Multiple Vitamin (MULTIVITAMIN) tablet Take 1 tablet by mouth daily.    [provider]  oxycodone (OXY-IR) 5 MG capsule Take 5 mg by mouth.    [provider]  pantoprazole (PROTONIX) 40 MG tablet Take  40 mg by mouth 2 (two) times daily.    [provider]  traMADol (ULTRAM) 50 MG tablet Take 1 tablet (50 mg total) by mouth every 6 (six) hours as needed for pain. Patient not taking: Reported on 11/26/2014 02/29/12   Geoffery Lyonselo, Douglas, MD  vitamin C (ASCORBIC ACID) 500 MG tablet Take 500 mg by mouth daily.    [provider]  zonisamide (ZONEGRAN) 50 MG capsule Take 200 mg by mouth.    [provider]     No family history on file.  Social History    Socioeconomic History  . Marital status: Legally Separated    Spouse name: Not on file  . Number of children: Not on file  . Years of education: Not on file  . Highest education level: Not on file  Occupational History  . Not on file  Social Needs  . Financial resource strain: Not on file  . Food insecurity    Worry: Not on file    Inability: Not on file  . Transportation needs    Medical: Not on file    Non-medical: Not on file  Tobacco Use  . Smoking status: Never Smoker  . Smokeless tobacco: Never Used  Substance and Sexual Activity  . Alcohol use: No  . Drug use: No  . Sexual activity: Yes    Birth control/protection: Surgical  Lifestyle  . Physical activity    Days per week: Not on file    Minutes per session: Not on file  . Stress: Not on file  Relationships  . Social Musicianconnections    Talks on phone: Not on file    Gets together: Not on file    Attends religious service: Not on file    Active member of club or organization: Not on file    Attends meetings of clubs or organizations: Not on file    Relationship status: Not on file  Other Topics Concern  . Not on file  Social History Narrative   ** Merged History Encounter **        ECOG Status: 1 - Symptomatic but completely ambulatory  Review of Systems: A 12 point ROS discussed and pertinent positives are indicated in the HPI above.  All other systems are negative.  Review of Systems  Vital Signs: There were no vitals taken for this visit.  Physical Exam   Imaging:  Lumbar spine radiographs - 07/25/2018 and 12/10/2018; Lumbar spine MRI - 12/20/2018.  Review of imaging demonstrates an acute/subacute mild (approximately 25%) compression deformity involving the superior endplate of L3 with associated fracture line.    Chronic mild (approximately 25%) compression deformity on the superior endplate of L4 without associated fracture line or STIR signal abnormality.  Labs:  CBC: No results for input(s):  WBC, HGB, HCT, PLT in the last 8760 hours.  COAGS: No results for input(s): INR, APTT in the last 8760 hours.  BMP: No results for input(s): NA, K, CL, CO2, GLUCOSE, BUN, CALCIUM, CREATININE, GFRNONAA, GFRAA in the last 8760 hours.  Invalid input(s): CMP  LIVER FUNCTION TESTS: No results for input(s): BILITOT, AST, ALT, ALKPHOS, PROT, ALBUMIN in the last 8760 hours.  TUMOR MARKERS: No results for input(s): AFPTM, CEA, CA199, CHROMGRNA in the last 8760 hours.  Assessment and Plan:  April Hurst is a 58 y.o. female with past medical history significant for hypertension, hyperlipidemia and chronic low back pain who experienced a fall on July 4 with acute worsening of her pre-existing chronic low  back pain.  The patient states that her pain regimen nearly completely alleviates her back pain, however without her pain medication her back pain is estimated to be approximately 10 out of 10 in severity.  The patient states that her pain is unchanged to slightly improved since the time of her fall in early July.  Review of lumbar spine MRI performed 12/20/2018 demonstrates an acute/subacute mild (approximately 25%) compression deformity involving the superior endplate of L3 with associated fracture line.  Chronic mild (approximately 25%) compression deformity on the superior endplate of L4 without associated fracture line or STIR signal abnormality.  Given patient's persistent symptoms and continued requirement of narcotic medication, I feel the patient is a decent candidate for image guided L3 kyphoplasty/cement augmentation.  As such, the risks and benefits of L3 kyphoplasty was discussed with the patient including, but not limited to education regarding the natural healing process of compression fractures without intervention, bleeding, infection, cement migration which may cause spinal cord damage, paralysis, pulmonary embolism or even death.  I explained that given her chronic baseline back  pain my hope was to alleviate her ACUTE symptomatology related to the fracture but would NOT expect back to a complete resolution of the patient's chronic/pre-existing back pain.  Following this prolonged and detailed discussion, the patient wishes to pursue L3 kyphoplasty.  As such, pending insurance approval, the procedure will be scheduled at [facility] on an outpatient basis.  While the patient would prefer that I performed the procedure, she is willing to have one of my capable interventional radiology performers perform the KP if it would mean it can be performed in a more expeditious manner.  The patient knows to call the interventional radiology clinic with any interval questions or concerns  Thank you for this interesting consult.  I greatly enjoyed meeting April Hurst and look forward to participating in their care.  A copy of this report was sent to the requesting provider on this date.  Electronically Signed: Sandi Mariscal 01/21/2019, 10:18 AM   I spent a total of 15 Minutes in face to face in clinical consultation, greater than 50% of which was counseling/coordinating care for L3 compression fracture

## 2019-01-30 ENCOUNTER — Other Ambulatory Visit: Payer: Self-pay | Admitting: Interventional Radiology

## 2019-01-30 DIAGNOSIS — S32030G Wedge compression fracture of third lumbar vertebra, subsequent encounter for fracture with delayed healing: Secondary | ICD-10-CM

## 2019-02-12 ENCOUNTER — Other Ambulatory Visit: Payer: Self-pay

## 2019-02-12 ENCOUNTER — Ambulatory Visit
Admission: RE | Admit: 2019-02-12 | Discharge: 2019-02-12 | Disposition: A | Discharge status: Home / Self Care | Payer: No Typology Code available for payment source | Source: Ambulatory Visit | Attending: Interventional Radiology | Admitting: Interventional Radiology

## 2019-02-12 ENCOUNTER — Encounter: Payer: Self-pay | Admitting: *Deleted

## 2019-02-12 DIAGNOSIS — S32030G Wedge compression fracture of third lumbar vertebra, subsequent encounter for fracture with delayed healing: Secondary | ICD-10-CM

## 2019-02-12 HISTORY — PX: IR RADIOLOGIST EVAL & MGMT: IMG5224

## 2019-02-12 NOTE — Progress Notes (Signed)
Patient ID: April Hurst, female   DOB: 05-02-1961, 58 y.o.   MRN: [id number]         Chief Complaint: Post L3 kyphoplasty   Referring Physician(s): Papineau, April  History of Present Illness:  April Hurst is a 58 y.o. female with past medical history significant for hypertension, hyperlipidemia and chronic low back pain who experienced a fall on July 4th with acute worsening of her pre-existing chronic low back pain.  The patient was seen on 01/21/2019 in consultation via telemedicine for evaluation of symptomatic L3 compression fracture demonstrated on lumbar spine MRI performed 12/20/2018.  In brief review, the patient reports a long history of low back pain previously requiring several years of oral prednisone.  Patient states that her pre-existing back pain was diffuse and unrelenting.  Prior to the procedure she was requiring Percocets 10/325 on a scheduled every 6 hours regimen and stated that while her pain regimen nearly completely alleviates her back pain, without her medication, her back pain is estimated to be approximately 10 out of 10 in severity.    For this the patient underwent a successful fluoroscopic guided kyphoplasty on 01/23/2019.  Patient reports marked improvement in her low back pain since undergoing the kyphoplasty with resolution of her acute on chronic back pain.  She states her back pain is now at its baseline prior to the fall she experienced in early July.  Patient is otherwise without complaint.  Specifically, no fever or chills.     Past Medical History:  Diagnosis Date  . Arthritis    back  . 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 Chronic back pain    neuropathy LLE; cervical and lumbar bulging discs, per pt  . Depression   . Fibromyalgia   . GERD (gastroesophageal reflux disease)   . Headache    migraines  . High cholesterol   . Hypertension   . Pneumonia 11/05/2014    Past Surgical History:  Procedure Laterality Date  . ABDOMINAL  HYSTERECTOMY    . ABDOMINAL HYSTERECTOMY  1990s   partial  . CARPAL TUNNEL RELEASE Bilateral 2015    one 10/2013; one 12/2013  . CHOLECYSTECTOMY    . CHOLECYSTECTOMY  2012  . IR RADIOLOGIST EVAL & MGMT  01/21/2019  . KNEE ARTHROSCOPY Right 03/23/2014   Procedure: RIGHT ARTHROSCOPY KNEE;  Surgeon: Kerin Salen, MD;  Location: [facility];  Service: Orthopedics;  Laterality: Right;  . TONSILLECTOMY    . TONSILLECTOMY AND ADENOIDECTOMY  1990s    Allergies: Patient has no known allergies.  Medications: Prior to Admission medications   Medication Sig Start Date End Date Taking? Authorizing Provider  aspirin 81 MG chewable tablet Chew 81 mg by mouth daily.    [provider]  atorvastatin (LIPITOR) 20 MG tablet Take 20 mg by mouth.    [provider]  Cholecalciferol (VITAMIN D-3) 5000 UNITS TABS Take 10,000 Units by mouth daily.    [provider]  DULoxetine (CYMBALTA) 20 MG capsule Take 20 mg by mouth daily.    [provider]  gabapentin (NEURONTIN) 300 MG capsule Take 300 mg by mouth 3 (three) times daily. 300 mg. In AM, 300 mg. At noon, and 900 mg. At night    [provider]  HYDROcodone-acetaminophen (NORCO) 5-325 MG per tablet Take 1-2 tablets by mouth every 6 (six) hours as needed for moderate pain. Patient not taking: Reported on 11/26/2014 04/20/14   Leighton Parody, PA-C  Multiple Vitamin (  MULTIVITAMIN) tablet Take 1 tablet by mouth daily.    [provider]  oxycodone (OXY-IR) 5 MG capsule Take 5 mg by mouth.    [provider]  pantoprazole (PROTONIX) 40 MG tablet Take 40 mg by mouth 2 (two) times daily.    [provider]  traMADol (ULTRAM) 50 MG tablet Take 1 tablet (50 mg total) by mouth every 6 (six) hours as needed for pain. Patient not taking: Reported on 11/26/2014 02/29/12   Geoffery Lyons, MD  vitamin C (ASCORBIC ACID) 500 MG tablet Take 500 mg by mouth daily.    [provider]   zonisamide (ZONEGRAN) 50 MG capsule Take 200 mg by mouth.    [provider]     No family history on file.  Social History   Socioeconomic History  . Marital status: Legally Separated    Spouse name: Not on file  . Number of children: Not on file  . Years of education: Not on file  . Highest education level: Not on file  Occupational History  . Not on file  Social Needs  . Financial resource strain: Not on file  . Food insecurity    Worry: Not on file    Inability: Not on file  . Transportation needs    Medical: Not on file    Non-medical: Not on file  Tobacco Use  . Smoking status: Never Smoker  . Smokeless tobacco: Never Used  Substance and Sexual Activity  . Alcohol use: No  . Drug use: No  . Sexual activity: Yes    Birth control/protection: Surgical  Lifestyle  . Physical activity    Days per week: Not on file    Minutes per session: Not on file  . Stress: Not on file  Relationships  . Social Musician on phone: Not on file    Gets together: Not on file    Attends religious service: Not on file    Active member of club or organization: Not on file    Attends meetings of clubs or organizations: Not on file    Relationship status: Not on file  Other Topics Concern  . Not on file  Social History Narrative   ** Merged History Encounter **        ECOG Status: 1 - Symptomatic but completely ambulatory  Review of Systems  Review of Systems: A 12 point ROS discussed and pertinent positives are indicated in the HPI above.  All other systems are negative.  Physical Exam No direct physical exam was performed (except for noted visual exam findings with Video Visits).   Vital Signs: There were no vitals taken for this visit.  Imaging:  Selected images from lumbar spine MRI performed 12/20/2018 as well as intraprocedural images during fluoroscopic guided L3 kyphoplasty performed 01/23/2019 were reviewed in detail.   Labs:  CBC: No  results for input(s): WBC, HGB, HCT, PLT in the last 8760 hours.  COAGS: No results for input(s): INR, APTT in the last 8760 hours.  BMP: No results for input(s): NA, K, CL, CO2, GLUCOSE, BUN, CALCIUM, CREATININE, GFRNONAA, GFRAA in the last 8760 hours.  Invalid input(s): CMP  LIVER FUNCTION TESTS: No results for input(s): BILITOT, AST, ALT, ALKPHOS, PROT, ALBUMIN in the last 8760 hours.  TUMOR MARKERS: No results for input(s): AFPTM, CEA, CA199, CHROMGRNA in the last 8760 hours.  Assessment and Plan:  April Hurst is a 58 y.o. female with past medical history significant for hypertension,  hyperlipidemia and chronic low back pain who experienced a fall on July 4th with acute worsening of her pre-existing chronic low back pain for which the patient underwent a technically successful L3 kyphoplasty on 01/23/2019.  The patient reports marked improvement in her low back pain since undergoing the kyphoplasty with resolution of her acute on chronic back pain.  She states her back pain is now at its baseline prior to the fall she experienced in early July.  Patient is otherwise without complaint.   I explained that as the patient has experienced one compression fracture, she could be at risk for experiencing future compression fractures and should not hesitate to call the interventional radiology clinic if she were to experience another episode of acute on chronic back pain, similar to the fracture related pain she experienced in July.    I also explained to the importance of undergoing osteoporotic screening given her long history of steroid use for which she tells me is having done next month.  The patient demonstrated excellent understanding of this conversation and knows to call the interventional radiology clinic with any future questions or concerns though may otherwise follow-up on a PRN basis.  A copy of this report was sent to the requesting provider on this date.  Electronically  Signed: Simonne ComeJohn Aseret Hoffman 02/12/2019, 3:41 PM   I spent a total of 5 Minutes in remote  clinical consultation, greater than 50% of which was counseling/coordinating care for L3 kyphoplasty.    Visit type: Audio only (telephone). Audio (no video) only due to patient's lack of internet/smartphone capability. [facility], [address], [place], [place]. This visit type was conducted due to national recommendations for restrictions regarding the COVID-19 Pandemic (e.g. social distancing).  This format is felt to be most appropriate for this patient at this time.  All issues noted in this document were discussed and addressed.
